# Patient Record
Sex: Male | Born: 1965 | Race: White | Marital: Married | Smoking: Never smoker
Health system: Southern US, Community
[De-identification: ages and names within clinical notes are randomized; demographics above are authoritative.]

## PROBLEM LIST (undated history)

## (undated) DIAGNOSIS — T7501XA Shock due to being struck by lightning, initial encounter: Secondary | ICD-10-CM

## (undated) DIAGNOSIS — E785 Hyperlipidemia, unspecified: Secondary | ICD-10-CM

## (undated) DIAGNOSIS — M349 Systemic sclerosis, unspecified: Secondary | ICD-10-CM

## (undated) DIAGNOSIS — G4733 Obstructive sleep apnea (adult) (pediatric): Secondary | ICD-10-CM

## (undated) DIAGNOSIS — Z9989 Dependence on other enabling machines and devices: Secondary | ICD-10-CM

## (undated) DIAGNOSIS — K219 Gastro-esophageal reflux disease without esophagitis: Secondary | ICD-10-CM

## (undated) HISTORY — DX: Hyperlipidemia, unspecified: E78.5

## (undated) HISTORY — DX: Systemic sclerosis, unspecified: M34.9

## (undated) HISTORY — PX: OTHER SURGICAL HISTORY: SHX169

## (undated) HISTORY — DX: Obstructive sleep apnea (adult) (pediatric): G47.33

## (undated) HISTORY — DX: Dependence on other enabling machines and devices: Z99.89

## (undated) HISTORY — DX: Shock due to being struck by lightning, initial encounter: T75.01XA

## (undated) HISTORY — DX: Gastro-esophageal reflux disease without esophagitis: K21.9

---

## 2012-08-19 ENCOUNTER — Other Ambulatory Visit: Payer: Self-pay

## 2012-08-19 ENCOUNTER — Ambulatory Visit
Admission: RE | Admit: 2012-08-19 | Discharge: 2012-08-19 | Disposition: A | Discharge status: Home / Self Care | Payer: Managed Care, Other (non HMO) | Source: Ambulatory Visit

## 2012-08-19 DIAGNOSIS — M542 Cervicalgia: Secondary | ICD-10-CM

## 2012-08-19 MED ORDER — GADOBENATE DIMEGLUMINE 529 MG/ML IV SOLN
17.0000 mL | Freq: Once | INTRAVENOUS | Status: AC | PRN
  Administered 2012-08-19: 17 mL via INTRAVENOUS

## 2015-02-18 ENCOUNTER — Emergency Department (HOSPITAL_COMMUNITY): Payer: 59

## 2015-02-18 ENCOUNTER — Encounter (HOSPITAL_COMMUNITY): Payer: Self-pay

## 2015-02-18 ENCOUNTER — Emergency Department (HOSPITAL_COMMUNITY)
Admission: EM | Admit: 2015-02-18 | Discharge: 2015-02-18 | Disposition: A | Discharge status: Home / Self Care | Payer: 59 | Attending: Emergency Medicine | Admitting: Emergency Medicine

## 2015-02-18 DIAGNOSIS — R1013 Epigastric pain: Secondary | ICD-10-CM | POA: Diagnosis not present

## 2015-02-18 DIAGNOSIS — R197 Diarrhea, unspecified: Secondary | ICD-10-CM | POA: Insufficient documentation

## 2015-02-18 DIAGNOSIS — R1012 Left upper quadrant pain: Secondary | ICD-10-CM | POA: Insufficient documentation

## 2015-02-18 DIAGNOSIS — J3489 Other specified disorders of nose and nasal sinuses: Secondary | ICD-10-CM | POA: Insufficient documentation

## 2015-02-18 LAB — CBC WITH DIFFERENTIAL/PLATELET
BASOS PCT: 0 % (ref 0–1)
Basophils Absolute: 0 10*3/uL (ref 0.0–0.1)
EOS PCT: 2 % (ref 0–5)
Eosinophils Absolute: 0.2 10*3/uL (ref 0.0–0.7)
HEMATOCRIT: 44.7 % (ref 39.0–52.0)
Hemoglobin: 15.3 g/dL (ref 13.0–17.0)
Lymphocytes Relative: 38 % (ref 12–46)
Lymphs Abs: 2.7 10*3/uL (ref 0.7–4.0)
MCH: 30.2 pg (ref 26.0–34.0)
MCHC: 34.2 g/dL (ref 30.0–36.0)
MCV: 88.3 fL (ref 78.0–100.0)
MONO ABS: 0.7 10*3/uL (ref 0.1–1.0)
Monocytes Relative: 10 % (ref 3–12)
Neutro Abs: 3.6 10*3/uL (ref 1.7–7.7)
Neutrophils Relative %: 50 % (ref 43–77)
Platelets: 184 10*3/uL (ref 150–400)
RBC: 5.06 MIL/uL (ref 4.22–5.81)
RDW: 12.8 % (ref 11.5–15.5)
WBC: 7.2 10*3/uL (ref 4.0–10.5)

## 2015-02-18 LAB — COMPREHENSIVE METABOLIC PANEL
ALBUMIN: 4.2 g/dL (ref 3.5–5.0)
ALT: 51 U/L (ref 17–63)
AST: 43 U/L — AB (ref 15–41)
Alkaline Phosphatase: 71 U/L (ref 38–126)
Anion gap: 5 (ref 5–15)
BILIRUBIN TOTAL: 0.6 mg/dL (ref 0.3–1.2)
BUN: 18 mg/dL (ref 6–20)
CHLORIDE: 104 mmol/L (ref 101–111)
CO2: 29 mmol/L (ref 22–32)
Calcium: 8.8 mg/dL — ABNORMAL LOW (ref 8.9–10.3)
Creatinine, Ser: 0.94 mg/dL (ref 0.61–1.24)
GFR calc Af Amer: >60 mL/min (ref >60)
Glucose, Bld: 111 mg/dL — ABNORMAL HIGH (ref 70–99)
Potassium: 3.9 mmol/L (ref 3.5–5.1)
SODIUM: 138 mmol/L (ref 135–145)
Total Protein: 7.3 g/dL (ref 6.5–8.1)

## 2015-02-18 LAB — URINALYSIS, ROUTINE W REFLEX MICROSCOPIC
Bilirubin Urine: NEGATIVE
Glucose, UA: NEGATIVE mg/dL
Hgb urine dipstick: NEGATIVE
KETONES UR: NEGATIVE mg/dL
Leukocytes, UA: NEGATIVE
Nitrite: NEGATIVE
PROTEIN: NEGATIVE mg/dL
Specific Gravity, Urine: 1.007 (ref 1.005–1.030)
Urobilinogen, UA: 0.2 mg/dL (ref 0.0–1.0)
pH: 6.5 (ref 5.0–8.0)

## 2015-02-18 LAB — LIPASE, BLOOD: Lipase: 32 U/L (ref 22–51)

## 2015-02-18 MED ORDER — CIPROFLOXACIN HCL 500 MG PO TABS: 500.0000 mg | ORAL_TABLET | Freq: Two times a day (BID) | ORAL | Status: DC

## 2015-02-18 MED ORDER — SODIUM CHLORIDE 0.9 % IV SOLN
1000.0000 mL | Freq: Once | INTRAVENOUS | Status: AC
  Administered 2015-02-18: 1000 mL via INTRAVENOUS

## 2015-02-18 MED ORDER — IOHEXOL 300 MG/ML  SOLN
25.0000 mL | Freq: Once | INTRAMUSCULAR | Status: AC | PRN
  Administered 2015-02-18: 25 mL via ORAL

## 2015-02-18 MED ORDER — LOPERAMIDE HCL 2 MG PO CAPS: 2.0000 mg | ORAL_CAPSULE | Freq: Four times a day (QID) | ORAL | Status: DC | PRN

## 2015-02-18 MED ORDER — IOHEXOL 300 MG/ML  SOLN
100.0000 mL | Freq: Once | INTRAMUSCULAR | Status: AC | PRN
  Administered 2015-02-18: 100 mL via INTRAVENOUS

## 2015-02-18 MED ORDER — MORPHINE SULFATE 4 MG/ML IJ SOLN
4.0000 mg | Freq: Once | INTRAMUSCULAR | Status: AC
  Administered 2015-02-18: 4 mg via INTRAVENOUS
  Filled 2015-02-18: qty 1

## 2015-02-18 MED ORDER — ONDANSETRON HCL 4 MG/2ML IJ SOLN
4.0000 mg | Freq: Once | INTRAMUSCULAR | Status: AC
  Administered 2015-02-18: 4 mg via INTRAVENOUS
  Filled 2015-02-18: qty 2

## 2015-02-18 MED ORDER — SODIUM CHLORIDE 0.9 % IV SOLN: 1000.0000 mL | INTRAVENOUS | Status: DC

## 2015-02-18 NOTE — ED Provider Notes (Signed)
CSN: [id number]     Arrival date & time 02/18/15  1832 History   First MD Initiated Contact with Patient 02/18/15 1912     Chief Complaint  Patient presents with  . Diarrhea   HPI Sx started 2 month ago Intermittent episodes of diarrhea.  History of foreign travel. Yesterday started having diarrhea.  Multiple episodes.  Started developing a headache.  Pain around the eyes.   Also having pain in pressure in the abdomen.  Diffusely tender but greater in the LUQ.  Feels that abdomen is distended.  No fevers. No blood in his stool. No vomiting.  No past medical history on file. No past surgical history on file. History reviewed. No pertinent family history. History  Substance Use Topics  . Smoking status: Never Smoker   . Smokeless tobacco: Not on file  . Alcohol Use: Yes    Review of Systems  Constitutional: Negative for fever.  HENT: Positive for sinus pressure.   Respiratory: Negative for cough and shortness of breath.   Genitourinary: Negative for dysuria.  Neurological: Negative for seizures.  All other systems reviewed and are negative.     Allergies  Review of patient's allergies indicates no known allergies.  Home Medications   Prior to Admission medications   Medication Sig Start Date End Date Taking? Authorizing Provider  ciprofloxacin (CIPRO) 500 MG tablet Take 1 tablet (500 mg total) by mouth 2 (two) times daily. 02/18/15   Linwood DibblesJon Mackinley Kiehn, MD  loperamide (IMODIUM) 2 MG capsule Take 1 capsule (2 mg total) by mouth 4 (four) times daily as needed for diarrhea or loose stools. 02/18/15   Linwood DibblesJon Kamyiah Colantonio, MD  Pseudoephedrine-APAP-DM (DAYQUIL PO) Take 2 capsules by mouth every 6 (six) hours as needed (cold like symptoms).   Yes Historical Provider, MD   BP 111/64 mmHg  Pulse 52  Temp(Src) 97.7 F (36.5 C) (Oral)  Resp 14  SpO2 96% Physical Exam  Constitutional: He appears well-developed and well-nourished. No distress.  HENT:  Head: Normocephalic and atraumatic.  Right Ear:  External ear normal.  Left Ear: External ear normal.  Eyes: Conjunctivae are normal. Right eye exhibits no discharge. Left eye exhibits no discharge. No scleral icterus.  Neck: Neck supple. No tracheal deviation present.  Cardiovascular: Normal rate, regular rhythm and intact distal pulses.   Pulmonary/Chest: Effort normal and breath sounds normal. No stridor. No respiratory distress. He has no wheezes. He has no rales.  Abdominal: Soft. Bowel sounds are normal. He exhibits no distension. There is tenderness in the epigastric area and left upper quadrant. There is no rebound and no guarding. No hernia.  Musculoskeletal: He exhibits no edema or tenderness.  Neurological: He is alert. He has normal strength. No cranial nerve deficit (no facial droop, extraocular movements intact, no slurred speech) or sensory deficit. He exhibits normal muscle tone. He displays no seizure activity. Coordination normal.  Skin: Skin is warm and dry. No rash noted.  Psychiatric: He has a normal mood and affect.  Nursing note and vitals reviewed.   ED Course  Procedures (including critical care time) Labs Review Labs Reviewed  COMPREHENSIVE METABOLIC PANEL - Abnormal; Notable for the following:    Glucose, Bld 111 (*)    Calcium 8.8 (*)    AST 43 (*)    All other components within normal limits  CBC WITH DIFFERENTIAL/PLATELET  LIPASE, BLOOD  URINALYSIS, ROUTINE W REFLEX MICROSCOPIC    Imaging Review Ct Abdomen Pelvis W Contrast  02/18/2015   CLINICAL DATA:  Diarrhea and bloating.  EXAM: CT ABDOMEN AND PELVIS WITH CONTRAST  TECHNIQUE: Multidetector CT imaging of the abdomen and pelvis was performed using the standard protocol following bolus administration of intravenous contrast.  CONTRAST:  25mL OMNIPAQUE IOHEXOL 300 MG/ML SOLN, OMNIPAQUE IOHEXOL 300 MG/ML SOLN  COMPARISON:  None.  FINDINGS: Lower chest:  No significant abnormality  Hepatobiliary: There is mild generalized fatty infiltration of the  liver. There is a 1.4 x 1.9 cm hypodense focus in the right hepatic lobe dome which is indeterminate although more likely benign. No other focal liver lesions are evident. There is no bile duct dilatation. The gallbladder is unremarkable.  Pancreas: No significant abnormality  Spleen: Normal  Adrenals/Urinary Tract: The adrenals and kidneys are normal in appearance. There is no urinary calculus evident. There is no hydronephrosis or ureteral dilatation. Collecting systems and ureters appear unremarkable.  Stomach/Bowel: There are normal appearances of the stomach, small bowel and colon. The appendix is normal.  Vascular/Lymphatic: The abdominal aorta is normal in caliber. There is no atherosclerotic calcification. There is no adenopathy in the abdomen or pelvis.  Other: No acute inflammatory changes are evident in the abdomen or pelvis. There is no ascites.  Musculoskeletal: There is a tiny fat containing umbilical hernia.  IMPRESSION: Mild fatty infiltration of the liver. Small focal hypodense lesion in the right hepatic lobe dome, not conclusively characterized. MRI will be the modality of choice for conclusive characterization. No acute findings are evident in the abdomen or pelvis.   Electronically Signed   By: Ellery Plunk M.D.   On: 02/18/2015 22:09     MDM   Final diagnoses:  Diarrhea    CT scan does not show any significant abnormalities. He does have an incidental lesion on the liver. I discussed this finding with the patient to have follow-up with his primary doctor.  No evidence of colitis. Blood testing is otherwise unremarkable. Plan on discharge with medications for Imodium. I will try Cipro empirically because of the recent travel.    Linwood Dibbles, MD 02/18/15 [phone]

## 2015-02-18 NOTE — Discharge Instructions (Signed)

## 2015-02-18 NOTE — ED Notes (Signed)
Patient is alert and oriented x3.  He was given DC instructions and follow up visit instructions.  Patient gave verbal understanding.  He was DC ambulatory under his own power to home.  V/S stable.  He was not showing any signs of distress on DC 

## 2015-02-18 NOTE — ED Notes (Signed)
He c/o frontal h/a yesterday and today.  He also c/o having one episode of diarrhea yesterday and a few more today with a feeling of abdominal "bloating".  He is in no distress.

## 2015-05-22 ENCOUNTER — Other Ambulatory Visit: Payer: Self-pay | Admitting: Gastroenterology

## 2015-05-22 DIAGNOSIS — K769 Liver disease, unspecified: Secondary | ICD-10-CM

## 2015-05-25 ENCOUNTER — Other Ambulatory Visit: Payer: Self-pay | Admitting: Gastroenterology

## 2015-08-27 ENCOUNTER — Ambulatory Visit: Payer: 59 | Attending: Dermatology | Admitting: Physical Therapy

## 2015-08-27 ENCOUNTER — Encounter: Payer: Self-pay | Admitting: Physical Therapy

## 2015-08-27 DIAGNOSIS — M436 Torticollis: Secondary | ICD-10-CM | POA: Diagnosis present

## 2015-08-27 DIAGNOSIS — G729 Myopathy, unspecified: Secondary | ICD-10-CM | POA: Diagnosis not present

## 2015-08-27 DIAGNOSIS — M6289 Other specified disorders of muscle: Secondary | ICD-10-CM

## 2015-08-27 NOTE — Patient Instructions (Addendum)
Chin Protraction / Retraction    Slide head forward keeping chin level. Slide head back, pulling chin in. Hold each position _30_ seconds. Repeat _2_ times. Do _2__ sessions per day.  Copyright  VHI. All rights reserved.  Upper Cervical Flexion / Extension    Gently flex and extend upper neck by nodding head. Try to make a "long neck". Hold _30___ seconds. Repeat __2__ times per set. Do _2___ sets per session. Do _2___ sessions per day.  http://orth.exer.us/350   Copyright  VHI. All rights reserved.  Lateral Flexion    With head in comfortable, centered position and chin slightly tucked, gently bring right ear toward right shoulder. Hold _30___ seconds. Repeat with left side. Repeat _2___ times. Do __2__ sessions per day.  http://gt2.exer.us/8   Copyright  VHI. All rights reserved.  AROM: Neck Rotation    Turn head slowly to look over one shoulder, then the other. Hold each position 30____ seconds. Repeat __2__ times per set. Do __2__ sets per session. Do _2___ sessions per day.  http://orth.exer.us/294   Copyright  VHI. All rights reserved.  Also do Mulligan's stretch with use of towel for overpressure with rotation stretch -hold 30 secs 2 reps

## 2015-08-27 NOTE — Therapy (Signed)
[facility] [address] [place], [place], [address] Phone: [phone]   Fax:  [phone]  Physical Therapy Evaluation  Patient Details  Name: Blake [name] MRN: [id number] Date of Birth: 07-23-1966 Referring Provider: Dr. Wynelle Cleveland  Encounter Date: 08/27/2015      PT End of Session - 08/27/15 0957    Visit Number 1   Number of Visits 4   Date for PT Re-Evaluation 10/20/15   Authorization Type [employer]   PT Start Time 0802   PT Stop Time 0848   PT Time Calculation (min) 46 min      History reviewed. No pertinent past medical history.  History reviewed. No pertinent past surgical history.  There were no vitals filed for this visit.  Visit Diagnosis:  Muscle tightness - Plan: PT plan of care cert/re-cert  Stiffness of neck - Plan: PT plan of care cert/re-cert      Subjective Assessment - 08/27/15 0945    Subjective Pt was diagnosed with scleredema 2 years ago; symptoms really started 3 yrs ago but it took a year to definitively diagnose as scleredema; pt reports main problem is decreased cervical  ROM   Patient Stated Goals Increase cervical ROM   Currently in Pain? No/denies            [facility] PT Assessment - 08/27/15 0809    Assessment   Medical Diagnosis Scleroderma   Referring Provider Dr. Wynelle Cleveland   Onset Date/Surgical Date --  2 yrs ago   Prior Therapy None   Precautions   Precautions None   Balance Screen   Has the patient fallen in the past 6 months No   Has the patient had a decrease in activity level because of a fear of falling?  No   Is the patient reluctant to leave their home because of a fear of falling?  No   Prior Function   Level of Independence Independent   Vocation Full time employment  in management   ROM / Strength   AROM / PROM / Strength AROM   AROM   Cervical Flexion 31   Cervical Extension 28   Cervical - Right Side Bend 26   Cervical - Left  Side Bend 30   Cervical - Right Rotation 39   Cervical - Left Rotation 33                           PT Education - 08/27/15 0953    Education provided Yes   Education Details cervical lateral flexion, rotation, extension and retraction; also gave Mulligan's stretch with use of towel to incr. rotation   Person(s) Educated Patient   Methods Explanation;Demonstration;Handout   Comprehension Verbalized understanding;Returned demonstration             PT Long Term Goals - 08/27/15 1143    PT LONG TERM GOAL #1   Title Independent in HEP for cervical stretches and AROM. (10-20-15)   Time 4   Period Weeks   Status New   PT LONG TERM GOAL #2   Title Increase L cervical rotation to >/= 50 degrees to increase ease and safety with driving. (97-02-63)   Time 4   Period Weeks   Status New               Plan - 08/27/15 7858    Clinical Impression Statement Pt. has moderately decreased cervical AROM with L rotation limited most  due to tightness in R upper traps   Pt will benefit from skilled therapeutic intervention in order to improve on the following deficits Hypomobility;Increased edema;Decreased range of motion;Impaired flexibility   Rehab Potential Good   PT Frequency Biweekly  4 visits over 8 weeks   PT Duration 4 weeks   PT Treatment/Interventions ADLs/Self Care Home Management;Moist Heat;Ultrasound;Therapeutic exercise;Neuromuscular re-education;Patient/family education;Manual techniques   PT Next Visit Plan assess benefits of ultrasound after rx today   PT Home Exercise Plan cervical stretches and AROM   Consulted and Agree with Plan of Care Patient         Problem List There are no active problems to display for this patient.   [name], [name], PT 08/27/2015, 12:38 PM  [facility] [address] [place], [place], [address] Phone: [phone]   Fax:  [phone]  Name:  Blake [name] MRN: [id number] Date of Birth: 10-11-1966

## 2015-09-13 ENCOUNTER — Encounter: Payer: Self-pay | Admitting: Physical Therapy

## 2015-09-17 ENCOUNTER — Ambulatory Visit: Payer: 59 | Admitting: Physical Therapy

## 2015-09-17 DIAGNOSIS — M6289 Other specified disorders of muscle: Secondary | ICD-10-CM

## 2015-09-17 DIAGNOSIS — M436 Torticollis: Secondary | ICD-10-CM

## 2015-09-17 DIAGNOSIS — G729 Myopathy, unspecified: Secondary | ICD-10-CM | POA: Diagnosis not present

## 2015-09-23 ENCOUNTER — Encounter: Payer: Self-pay | Admitting: Physical Therapy

## 2015-09-23 NOTE — Therapy (Signed)
[facility] [address] [place], [place], [address] Phone: [phone]   Fax:  [phone]  Physical Therapy Treatment  Patient Details  Name: Blake [name] MRN: [id number] Date of Birth: [birth year] Referring Provider: Dr. Wynelle ClevelandJoseph Jorizzo  Encounter Date: 09/17/2015      PT End of Session - 09/23/15 1749    Visit Number 2   Number of Visits 4   Date for PT Re-Evaluation 10/20/15   Authorization Type [employer]   PT Start Time 1448   PT Stop Time 1531   PT Time Calculation (min) 43 min      History reviewed. No pertinent past medical history.  History reviewed. No pertinent past surgical history.  There were no vitals filed for this visit.  Visit Diagnosis:  Muscle tightness  Stiffness of neck      Subjective Assessment - 09/23/15 1745    Subjective Pt states that he thinks the ultrasound helped last time- has been doing stretches at home   Patient Stated Goals Increase cervical ROM   Currently in Pain? No/denies                         [facility] - 09/23/15 0001    Modalities   Modalities Ultrasound   Ultrasound   Ultrasound Location bil. upper trap and posterior cervical region   Ultrasound Parameters 1.5 w/cm2 50% pulsed 8" to each side   Ultrasound Goals Edema     TherEx:  Lying prone - lifting arms overhead with shoulder flexion - as tolerated- difficult to perform due to tightness Closed chain wall push ups x 5 reps Quadriped - lifting head into cervical extension as tolerated - 5 reps attempted UBE bike - 3" forward and 3" backward for shoulder and upper thoracic ROM            PT Education - 09/23/15 1748    Education provided Yes   Education Details prone - lifting arms overhead as tolerated   Person(s) Educated Patient   Methods Explanation;Demonstration   Comprehension Verbalized understanding;Returned demonstration             PT Long Term Goals - 08/27/15 1143    PT LONG TERM GOAL #1   Title Independent in HEP for cervical stretches and AROM. (10-20-15)   Time 4   Period Weeks   Status New   PT LONG TERM GOAL #2   Title Increase L cervical rotation to >/= 50 degrees to increase ease and safety with driving. (13-24-4012-31-16)   Time 4   Period Weeks   Status New               Plan - 09/23/15 1756    PT Next Visit Plan cont with US and ther ex; trying quadriped - arms shifted to one side while sitting back on heels; seated - leaning on foream and reaching overhead with other arm        Problem List There are no active problems to display for this patient.   NUUVOZ, DGUYQ IHKVQQVilday, Sabirin Baray Suzanne, PT 09/23/2015, 5:59 PM  [facility] [address] [place], [place], [address] Phone: [phone]   Fax:  [phone]  Name: Blake [name] MRN: [id number] Date of Birth: [birth year]

## 2015-10-01 ENCOUNTER — Ambulatory Visit: Payer: 59 | Attending: Dermatology | Admitting: Physical Therapy

## 2015-10-01 DIAGNOSIS — M6289 Other specified disorders of muscle: Secondary | ICD-10-CM

## 2015-10-01 DIAGNOSIS — G729 Myopathy, unspecified: Secondary | ICD-10-CM | POA: Diagnosis present

## 2015-10-01 DIAGNOSIS — M436 Torticollis: Secondary | ICD-10-CM | POA: Diagnosis present

## 2015-10-07 ENCOUNTER — Encounter: Payer: Self-pay | Admitting: Physical Therapy

## 2015-10-07 NOTE — Therapy (Signed)
[facility] [address] [place], [place], [address] Phone: [phone]   Fax:  [phone]  Physical Therapy Treatment  Patient Details  Name: Blake [name] MRN: [id number] Date of Birth: 20-Nov-1965 Referring Provider: Dr. Saundra Shelling  Encounter Date: 10/01/2015      PT End of Session - 10/07/15 2041    Visit Number 3   Number of Visits 4   Date for PT Re-Evaluation 10/20/15   Authorization Type [employer]   PT Start Time 1533   PT Stop Time 1619   PT Time Calculation (min) 46 min      History reviewed. No pertinent past medical history.  History reviewed. No pertinent past surgical history.  There were no vitals filed for this visit.  Visit Diagnosis:  Muscle tightness  Stiffness of neck      Subjective Assessment - 10/07/15 2038    Subjective Pt states the swelling continues to fluctuate - some days are better than others; states he knows exercises to do now - feels that he is ready for discharge   Patient Stated Goals Increase cervical ROM   Currently in Pain? No/denies                         [facility] Adult PT Treatment/Exercise - 10/07/15 0001    Modalities   Modalities Ultrasound   Ultrasound   Ultrasound Location bil. upper trap and posterior cervical regions   Ultrasound Parameters 1.5 w/cm2 50% pulsed 8" to each side    Ultrasound Goals Edema     TherEx; pectoral stretch in corner x 45 sec hold x 2 reps; Y stretch 30 sec hold x 2 reps Quadriped position - attempting to lift UE up in flexion - 10 sec hold as able  SciFit 5" level 2.0 - UE's and LE's  UBE 3" forward level 1.8;  3" backward level 1.8 for UE AROM  Trunk rotation stretch - cat/camel in quadriped position x 10 sec hold each position Sitting back toward heels with arms outstretched - 30 sec hold x 1 rep              PT Long Term Goals - 10/07/15 2043    PT LONG TERM GOAL #1   Title  Independent in HEP for cervical stretches and AROM. (10-20-15)   Baseline met 10-01-15   Status Achieved   PT LONG TERM GOAL #2   Title Increase L cervical rotation to >/= 50 degrees to increase ease and safety with driving. (60-63-01)   Baseline L cervical rotation 33 degrees; R 39 degrees   Status Not Met               Plan - 10/07/15 2042    Clinical Impression Statement Pt has met LTG #1; LTG #2 not met due to no incr. in cervical rotation AROM  - pt is ready for discharge; status continues to fluctuate depending on activity/edema   Pt will benefit from skilled therapeutic intervention in order to improve on the following deficits Hypomobility;Increased edema;Decreased range of motion;Impaired flexibility   Rehab Potential Good   PT Frequency Biweekly   PT Duration 4 weeks   PT Treatment/Interventions ADLs/Self Care Home Management;Moist Heat;Ultrasound;Therapeutic exercise;Neuromuscular re-education;Patient/family education;Manual techniques   PT Next Visit Plan D/C   PT Home Exercise Plan cervical stretches and AROM   Consulted and Agree with Plan of Care Patient        Problem List There are  no active problems to display for this patient.   PHYSICAL THERAPY DISCHARGE SUMMARY  Visits from Start of Care: 3  Current functional level related to goals / functional outcomes: See above for progress towards goals   Remaining deficits: Continued limited ROM in cervical, shoulder and trunk due to scleredema which fluctuates in severity which impacts ROM    Education / Equipment: Pt has been instructed in a HEP for stretching and ROM for cervical, shoulder and upper trunk musculature Plan: Patient agrees to discharge.  Patient goals were partially met. Patient is being discharged due to meeting the stated rehab goals.  ?????       Alda Lea, PT 10/07/2015, 8:46 PM  [employer] [address] [place], [place], [address] Phone: [phone]   Fax:  [phone]  Name: Blake [name] MRN: [id number] Date of Birth: 1966/01/10

## 2015-11-22 ENCOUNTER — Other Ambulatory Visit: Payer: Self-pay | Admitting: Gastroenterology

## 2015-11-22 DIAGNOSIS — K769 Liver disease, unspecified: Secondary | ICD-10-CM

## 2015-11-29 ENCOUNTER — Ambulatory Visit
Admission: RE | Admit: 2015-11-29 | Discharge: 2015-11-29 | Disposition: A | Discharge status: Home / Self Care | Payer: 59 | Source: Ambulatory Visit | Attending: Gastroenterology | Admitting: Gastroenterology

## 2015-11-29 DIAGNOSIS — K769 Liver disease, unspecified: Secondary | ICD-10-CM

## 2015-11-29 MED ORDER — GADOBENATE DIMEGLUMINE 529 MG/ML IV SOLN
20.0000 mL | Freq: Once | INTRAVENOUS | Status: AC | PRN
  Administered 2015-11-29: 20 mL via INTRAVENOUS

## 2016-05-06 ENCOUNTER — Other Ambulatory Visit (INDEPENDENT_AMBULATORY_CARE_PROVIDER_SITE_OTHER): Payer: 59

## 2016-05-06 DIAGNOSIS — Z0000 Encounter for general adult medical examination without abnormal findings: Secondary | ICD-10-CM | POA: Diagnosis not present

## 2016-05-06 LAB — CBC WITH DIFFERENTIAL/PLATELET
BASOS ABS: 0 10*3/uL (ref 0.0–0.1)
BASOS PCT: 0.4 % (ref 0.0–3.0)
EOS ABS: 0.1 10*3/uL (ref 0.0–0.7)
Eosinophils Relative: 2.1 % (ref 0.0–5.0)
HCT: 46.4 % (ref 39.0–52.0)
Hemoglobin: 16.2 g/dL (ref 13.0–17.0)
LYMPHS ABS: 1.9 10*3/uL (ref 0.7–4.0)
Lymphocytes Relative: 33.4 % (ref 12.0–46.0)
MCHC: 34.9 g/dL (ref 30.0–36.0)
MCV: 87.5 fl (ref 78.0–100.0)
MONOS PCT: 12.6 % — AB (ref 3.0–12.0)
Monocytes Absolute: 0.7 10*3/uL (ref 0.1–1.0)
NEUTROS ABS: 3 10*3/uL (ref 1.4–7.7)
NEUTROS PCT: 51.5 % (ref 43.0–77.0)
PLATELETS: 187 10*3/uL (ref 150.0–400.0)
RBC: 5.3 Mil/uL (ref 4.22–5.81)
RDW: 13 % (ref 11.5–15.5)
WBC: 5.8 10*3/uL (ref 4.0–10.5)

## 2016-05-06 LAB — POC URINALSYSI DIPSTICK (AUTOMATED)
BILIRUBIN UA: NEGATIVE
Glucose, UA: NEGATIVE
Ketones, UA: NEGATIVE
LEUKOCYTES UA: NEGATIVE
NITRITE UA: NEGATIVE
PH UA: 6
PROTEIN UA: NEGATIVE
Spec Grav, UA: 1.02
UROBILINOGEN UA: 1

## 2016-05-06 LAB — HEPATIC FUNCTION PANEL
ALBUMIN: 4.4 g/dL (ref 3.5–5.2)
ALK PHOS: 71 U/L (ref 39–117)
ALT: 55 U/L — AB (ref 0–53)
AST: 43 U/L — AB (ref 0–37)
Bilirubin, Direct: 0.2 mg/dL (ref 0.0–0.3)
TOTAL PROTEIN: 7.5 g/dL (ref 6.0–8.3)
Total Bilirubin: 1.1 mg/dL (ref 0.2–1.2)

## 2016-05-06 LAB — LIPID PANEL
CHOLESTEROL: 154 mg/dL (ref 0–200)
HDL: 39.1 mg/dL (ref >39.00)
LDL Cholesterol: 81 mg/dL (ref 0–99)
NONHDL: 115.24
Total CHOL/HDL Ratio: 4
Triglycerides: 173 mg/dL — ABNORMAL HIGH (ref 0.0–149.0)
VLDL: 34.6 mg/dL (ref 0.0–40.0)

## 2016-05-06 LAB — BASIC METABOLIC PANEL
BUN: 15 mg/dL (ref 6–23)
CHLORIDE: 102 meq/L (ref 96–112)
CO2: 29 mEq/L (ref 19–32)
Calcium: 9.7 mg/dL (ref 8.4–10.5)
Creatinine, Ser: 0.99 mg/dL (ref 0.40–1.50)
GFR: 85.85 mL/min (ref >60.00)
Glucose, Bld: 100 mg/dL — ABNORMAL HIGH (ref 70–99)
POTASSIUM: 4 meq/L (ref 3.5–5.1)
Sodium: 138 mEq/L (ref 135–145)

## 2016-05-06 LAB — TSH: TSH: 1.12 u[IU]/mL (ref 0.35–4.50)

## 2016-05-13 ENCOUNTER — Encounter: Payer: Self-pay | Admitting: Family Medicine

## 2016-05-13 ENCOUNTER — Ambulatory Visit (INDEPENDENT_AMBULATORY_CARE_PROVIDER_SITE_OTHER): Payer: 59 | Admitting: Family Medicine

## 2016-05-13 VITALS — BP 126/80 | HR 60 | Temp 98.1°F | Ht 71.0 in | Wt 241.4 lb

## 2016-05-13 DIAGNOSIS — Z0000 Encounter for general adult medical examination without abnormal findings: Secondary | ICD-10-CM | POA: Diagnosis not present

## 2016-05-13 DIAGNOSIS — E782 Mixed hyperlipidemia: Secondary | ICD-10-CM | POA: Diagnosis not present

## 2016-05-13 DIAGNOSIS — Z9989 Dependence on other enabling machines and devices: Secondary | ICD-10-CM

## 2016-05-13 DIAGNOSIS — R740 Nonspecific elevation of levels of transaminase and lactic acid dehydrogenase [LDH]: Secondary | ICD-10-CM | POA: Diagnosis not present

## 2016-05-13 DIAGNOSIS — R7401 Elevation of levels of liver transaminase levels: Secondary | ICD-10-CM

## 2016-05-13 DIAGNOSIS — G4733 Obstructive sleep apnea (adult) (pediatric): Secondary | ICD-10-CM

## 2016-05-13 NOTE — Progress Notes (Signed)
HPI:  Mr. Correy Bourcier is a 50 y.o.male here today for his routine physical examination. He is former [employer] pt, I last saw him about a year ago.    -Concerns and/or follow up today: none.  Last CPE about a year ago.  He is now following a healthy diet and exercise regularly for about 4-6 weeks. He has already noted wt loss and reporting feeling better in general.     Chronic medical problems: HLD, scleroderma, OSA,and chronic diarrhea among some.  He follows with gastroenterologist, reporting colonoscopy done in December 2016., Polypectomy performed, and 5 years follow up recommended. History of scleroderma, evaluated by dermatologist (Dr Reche Dixon), due to mild case treatment was not deemed necessary.  Hx of OSA, he follows with Dr. Earl Gala at [facility], wearing his CPAP machine daily, he has noted a great deal of improvement of fatigue.  He had labs recently.   Lab Results  Component Value Date   CHOL 154 05/06/2016   HDL 39.10 05/06/2016   LDLCALC 81 05/06/2016   TRIG 173.0 (H) 05/06/2016   CHOLHDL 4 05/06/2016   Lab Results  Component Value Date   CREATININE 0.99 05/06/2016   BUN 15 05/06/2016   NA 138 05/06/2016   K 4.0 05/06/2016   CL 102 05/06/2016   CO2 29 05/06/2016   Lab Results  Component Value Date   WBC 5.8 05/06/2016   HGB 16.2 05/06/2016   HCT 46.4 05/06/2016   MCV 87.5 05/06/2016   PLT 187.0 05/06/2016   Lab Results  Component Value Date   ALT 55 (H) 05/06/2016   AST 43 (H) 05/06/2016   ALKPHOS 71 05/06/2016   BILITOT 1.1 05/06/2016    Hx of STD's: Denies.   + FHx for prostate cancer, father at 82. Negative FHx for colon cancer.   Reporting Tdap 3 years ago at work.   -Denies alcohol abuse, tobacco use, or illicit drug use.  He lives with wife and their 3 children.   Review of Systems  Constitutional: Negative for activity change, appetite change, fatigue, fever and unexpected weight change.    HENT: Negative for dental problem, nosebleeds, sore throat, trouble swallowing and voice change.   Eyes: Negative for discharge, redness and visual disturbance.  Respiratory: Negative for apnea, cough, shortness of breath and wheezing.   Cardiovascular: Negative for chest pain, palpitations and leg swelling.  Gastrointestinal: Negative for abdominal pain, blood in stool, nausea and vomiting.       No changes in bowel habits.  Endocrine: Negative for cold intolerance, heat intolerance, polydipsia and polyuria.  Genitourinary: Negative for decreased urine volume, discharge, dysuria, frequency, genital sores, hematuria and testicular pain.  Musculoskeletal: Positive for back pain (occasional.). Negative for arthralgias, gait problem, joint swelling and myalgias.  Skin: Negative for color change and rash.  Neurological: Negative for dizziness, seizures, syncope, weakness, numbness and headaches.  Hematological: Negative for adenopathy. Does not bruise/bleed easily.  Psychiatric/Behavioral: Negative for confusion and sleep disturbance. The patient is not nervous/anxious.      No current outpatient prescriptions on file prior to visit.   No current facility-administered medications on file prior to visit.      Past Medical History:  Diagnosis Date  . Hyperlipidemia   . OSA on CPAP   . Scleroderma (HCC)     No Known Allergies  Family History  Problem Relation Age of Onset  . Arthritis Mother   . Mental retardation Daughter   . Prostate cancer  Paternal Grandfather 67    Social History   Social History  . Marital status: Married    Spouse name: N/A  . Number of children: N/A  . Years of education: N/A   Social History Main Topics  . Smoking status: Never Smoker  . Smokeless tobacco: Never Used  . Alcohol use Yes  . Drug use: No  . Sexual activity: Yes   Other Topics Concern  . None   Social History Narrative   ** Merged History Encounter **         Vitals:    05/13/16 1101  BP: 126/80  Pulse: 60  Temp: 98.1 F (36.7 C)   Body mass index is 33.66 kg/m. O2 sat at RA 97%.   Wt Readings from Last 3 Encounters:  05/13/16 241 lb 6 oz (109.5 kg)  11/29/15 245 lb (111.1 kg)      Physical Exam  Constitutional: He is oriented to person, place, and time. He appears well-developed. No distress.  HENT:  Head: Atraumatic.  Mouth/Throat: Uvula is midline, oropharynx is clear and moist and mucous membranes are normal.  Eyes: Conjunctivae and EOM are normal. Pupils are equal, round, and reactive to light.  Neck: Normal range of motion. No tracheal deviation present. No thyroid mass and no thyromegaly present.  Cardiovascular: Normal rate and regular rhythm.   No murmur heard. Pulses:      Dorsalis pedis pulses are 2+ on the right side, and 2+ on the left side.  Respiratory: Effort normal and breath sounds normal. No respiratory distress.  GI: Soft. He exhibits no mass. There is no hepatomegaly. There is no tenderness.  Genitourinary:  Genitourinary Comments: Refused,no concerns.  Musculoskeletal: He exhibits no edema or tenderness.  No major deformities appreciated and no signs of synovitis.  Lymphadenopathy:    He has no cervical adenopathy.       Right: No supraclavicular adenopathy present.       Left: No supraclavicular adenopathy present.  Neurological: He is alert and oriented to person, place, and time. He has normal strength. No cranial nerve deficit or sensory deficit. Coordination and gait normal.  Reflex Scores:      Bicep reflexes are 2+ on the right side and 2+ on the left side.      Patellar reflexes are 2+ on the right side and 2+ on the left side. Skin: Skin is warm. No rash noted. No erythema.  Tight/hard skin of his upper back, chest, arms with peau de orange changes on upper back. No significant pigmentation changes appreciated.  No suspicious lesions appreciated. A few scattered cherry moles (hemangiomas), 1-2 mm on  abdomen, chest, and back.  Psychiatric: He has a normal mood and affect.        ASSESSMENT AND PLAN:   Discussed the following assessment and plan:  Routine general medical examination at a health care facility  -Current preventive preventive guidelines discussed. Discussed the importance of a healthy diet and regular exercise for prevention of some chronic medical conditions. Next CPE in a year.   Elevated transaminase level  Mild, improved. Asymptomatic. Continue working on healthy diet and weight loss.  Hyperlipidemia, mixed  Improved. For now he will continue low-fat diet. Low HDL, regular physical activity and  2 oz of wine 5 times per week may also help. Follow-up in 12 months.    -We will continue seeing him annually, before if needed. -He will continue following with gastroenterologist and sleep specialist for chronic diarrhea and OSA respectively.  Return in 1 year (on 05/13/2017) for cpe..     Sindia Kowalczyk G. [place], MD  [facility]. [place] office.

## 2016-05-13 NOTE — Patient Instructions (Addendum)
A few things to remember from today's visit:   Routine general medical examination at a health care facility  Elevated transaminase level  Improved.    At least 150 minutes of moderate exercise per week, daily brisk walking for 15-30 min is a good exercise option. Healthy diet low in saturated (animal) fats and sweets and consisting of fresh fruits and vegetables, lean meats such as fish and white chicken and whole grains.  - Vaccines:  Tdap vaccine every 10 years.  Shingles vaccine recommended at age 32, could be given after 48 years of age but not sure about insurance coverage.  Pneumonia vaccines:  Prevnar 13 at 65 and Pneumovax at 32.   -Screening recommendations for low/normal risk males:  Screening for diabetes at age 79-45 and every 3 years.    Lipid screening at 35 and every 3 years.   Colonoscopy for colon cancer screening at age 75 and until age 40.  Prostate cancer screening: some controversy.        Please be sure medication list is accurate. If a new problem present, please set up appointment sooner than planned today.

## 2016-05-13 NOTE — Progress Notes (Signed)
Pre visit review using our clinic review tool, if applicable. No additional management support is needed unless otherwise documented below in the visit note. 

## 2016-05-22 ENCOUNTER — Ambulatory Visit: Payer: 59 | Admitting: Family Medicine

## 2017-05-08 ENCOUNTER — Other Ambulatory Visit: Payer: 59

## 2017-05-15 ENCOUNTER — Encounter: Payer: 59 | Admitting: Family Medicine

## 2017-06-02 DIAGNOSIS — L940 Localized scleroderma [morphea]: Secondary | ICD-10-CM | POA: Insufficient documentation

## 2017-06-02 DIAGNOSIS — M349 Systemic sclerosis, unspecified: Secondary | ICD-10-CM | POA: Insufficient documentation

## 2017-06-02 NOTE — Progress Notes (Signed)
HPI:  Blake [name] is a 51 y.o.male here today for his routine physical examination.  Last CPE 05/13/16.  He lives with his wife and their 3 children.  Regular exercise 3 or more times per week: 2-3 times per week, not as consistent as he was last year. Following a healthy diet: Has not ben very consistent.This years he has ahd more stress at work and this increase eating, snacking.   Chronic medical problems: OSA on CPAP,HLD, and scleroderma among some. HLD: Currently on nonpharmacologic treatment.  He follows with dermatologist as needed, Dr Reche DixonJorizzo. He [facility] for [facility].  Hx of STD's: Denies.  Colonoscopy done because chronic intermittent diarrhea done in 09/2015, per pt report. He has appt to repeat colonoscopy this year at [facility]. Tdap 3-4 years ago, he thinks it was through work.  -Denies high alcohol intake, tobacco use, or Hx of illicit drug use.  He has no concerns today.    Review of Systems  Constitutional: Negative for activity change, appetite change, fatigue, fever and unexpected weight change.  HENT: Negative for dental problem, nosebleeds, sore throat, trouble swallowing and voice change.   Eyes: Negative for redness and visual disturbance.  Respiratory: Negative for cough, shortness of breath and wheezing.   Cardiovascular: Negative for chest pain, palpitations and leg swelling.  Gastrointestinal: Negative for abdominal pain, blood in stool, nausea and vomiting.  Endocrine: Negative for polydipsia and polyuria.  Genitourinary: Negative for decreased urine volume, dysuria, genital sores, hematuria and testicular pain.  Musculoskeletal: Negative for gait problem, joint swelling and myalgias.  Skin: Negative for color change and rash.  Neurological: Negative for dizziness, syncope, weakness, numbness and headaches.  Hematological: Negative for adenopathy. Does not bruise/bleed easily.    Psychiatric/Behavioral: Negative for confusion and sleep disturbance. The patient is not nervous/anxious.   All other systems reviewed and are negative.    Current Outpatient Prescriptions on File Prior to Visit  Medication Sig Dispense Refill  . Probiotic Product (ALIGN PO) Take by mouth daily.     No current facility-administered medications on file prior to visit.      Past Medical History:  Diagnosis Date  . Hyperlipidemia   . OSA on CPAP   . Scleroderma (HCC)    No past surgical history on file.  No Known Allergies  Family History  Problem Relation Age of Onset  . Arthritis Mother   . Mental retardation Daughter   . Prostate cancer Paternal Grandfather [id number]    Social History   Social History  . Marital status: Married    Spouse name: N/A  . Number of children: N/A  . Years of education: N/A   Social History Main Topics  . Smoking status: Never Smoker  . Smokeless tobacco: Never Used  . Alcohol use Yes  . Drug use: No  . Sexual activity: Yes   Other Topics Concern  . None   Social History Narrative   ** Merged History Encounter **         Vitals:   06/03/17 0747  BP: 120/80  Pulse: 66  Resp: 12  SpO2: 95%   Body mass index is 34.21 kg/m.   Wt Readings from Last 3 Encounters:  06/03/17 245 lb 4 oz (111.2 kg)  05/13/16 241 lb 6 oz (109.5 kg)  11/29/15 245 lb (111.1 kg)    Physical Exam  Nursing note and vitals reviewed. Constitutional: He is oriented to person,  place, and time. He appears well-developed. No distress.  HENT:  Head: Normocephalic and atraumatic.  Right Ear: Hearing, tympanic membrane, external ear and ear canal normal.  Left Ear: Hearing, tympanic membrane, external ear and ear canal normal.  Mouth/Throat: Oropharynx is clear and moist and mucous membranes are normal.  Excess cerumen bilateral, TM's partially visualized.  Eyes: Pupils are equal, round, and reactive to light. Conjunctivae and EOM are normal.  Neck:  Normal range of motion. No tracheal deviation present. No thyromegaly present.  Cardiovascular: Normal rate and regular rhythm.   No murmur heard. Pulses:      Dorsalis pedis pulses are 2+ on the right side, and 2+ on the left side.  Respiratory: Effort normal and breath sounds normal. No respiratory distress.  GI: Soft. He exhibits no mass. There is no tenderness.  Genitourinary:  Genitourinary Comments: Deferred,no concerns.  Musculoskeletal: He exhibits no edema or tenderness.  No major deformities appreciated and no signs of synovitis.  Lymphadenopathy:    He has no cervical adenopathy.       Right: No supraclavicular adenopathy present.       Left: No supraclavicular adenopathy present.  Neurological: He is alert and oriented to person, place, and time. He has normal strength. No cranial nerve deficit or sensory deficit. Coordination and gait normal.  Reflex Scores:      Bicep reflexes are 2+ on the right side and 2+ on the left side.      Patellar reflexes are 2+ on the right side and 2+ on the left side. Skin: Skin is warm. No rash noted. No erythema.  Harding-firm skin on trunk,upper extremities,back,and neck. No rash or pigmentation changes.  Psychiatric: He has a normal mood and affect.     ASSESSMENT AND PLAN:   [name] was seen today for annual exam.  Diagnoses and all orders for this visit:  Lab Results  Component Value Date   CHOL 157 06/03/2017   HDL 44.60 06/03/2017   LDLCALC 81 06/03/2017   TRIG 155.0 (H) 06/03/2017   CHOLHDL 4 06/03/2017   Lab Results  Component Value Date   HGBA1C 5.5 06/03/2017     Chemistry      Component Value Date/Time   NA 138 06/03/2017 0842   K 4.1 06/03/2017 0842   CL 102 06/03/2017 0842   CO2 28 06/03/2017 0842   BUN 17 06/03/2017 0842   CREATININE 0.94 06/03/2017 0842      Component Value Date/Time   CALCIUM 9.2 06/03/2017 0842   ALKPHOS 62 06/03/2017 0842   AST 46 (H) 06/03/2017 0842   ALT 71 (H) 06/03/2017  0842   BILITOT 0.9 06/03/2017 0842      Routine general medical examination at a health care facility   We discussed the importance of regular physical activity and healthy diet for prevention of chronic illness and/or complications. Preventive guidelines reviewed. Vaccination up to date, per pt report. Next CPE in 1 year.  The 10-year ASCVD risk score Denman George DC Montez Hageman., et al., 2013) is: 2%   Values used to calculate the score:     Age: 17 years     Sex: Male     Is Non-Hispanic African American: No     Diabetic: No     Tobacco smoker: No     Systolic Blood Pressure: 120 mmHg     Is BP treated: No     HDL Cholesterol: 44.6 mg/dL     Total Cholesterol: 157 mg/dL  Scleroderma, localized  Stable. Monitor for changes or pigmentations changes among some. Continue following with derma as needed.  -     Hemoglobin A1c  Diabetes mellitus screening -     Comprehensive metabolic panel -     Hemoglobin A1c  Hyperlipidemia, mixed  Continue low fat diet. We will follow labs done today and will give further recommendations accordingly.  -     Lipid panel  Encounter for screening for HIV -     HIV antibody  OSA on CPAP  Continue following with Dr Earl Gala. Wt loss recommended.    Return in 1 year (on 06/03/2018).    Dyana Magner G. [place], MD  [facility]. [place] office.

## 2017-06-03 ENCOUNTER — Encounter: Payer: Self-pay | Admitting: Family Medicine

## 2017-06-03 ENCOUNTER — Ambulatory Visit (INDEPENDENT_AMBULATORY_CARE_PROVIDER_SITE_OTHER): Payer: 59 | Admitting: Family Medicine

## 2017-06-03 ENCOUNTER — Other Ambulatory Visit: Payer: Self-pay | Admitting: Family Medicine

## 2017-06-03 VITALS — BP 120/80 | HR 66 | Resp 12 | Ht 71.0 in | Wt 245.2 lb

## 2017-06-03 DIAGNOSIS — L940 Localized scleroderma [morphea]: Secondary | ICD-10-CM

## 2017-06-03 DIAGNOSIS — E782 Mixed hyperlipidemia: Secondary | ICD-10-CM

## 2017-06-03 DIAGNOSIS — Z131 Encounter for screening for diabetes mellitus: Secondary | ICD-10-CM | POA: Diagnosis not present

## 2017-06-03 DIAGNOSIS — Z9989 Dependence on other enabling machines and devices: Secondary | ICD-10-CM | POA: Diagnosis not present

## 2017-06-03 DIAGNOSIS — Z0000 Encounter for general adult medical examination without abnormal findings: Secondary | ICD-10-CM | POA: Diagnosis not present

## 2017-06-03 DIAGNOSIS — G4733 Obstructive sleep apnea (adult) (pediatric): Secondary | ICD-10-CM | POA: Diagnosis not present

## 2017-06-03 DIAGNOSIS — R740 Nonspecific elevation of levels of transaminase and lactic acid dehydrogenase [LDH]: Principal | ICD-10-CM

## 2017-06-03 DIAGNOSIS — Z114 Encounter for screening for human immunodeficiency virus [HIV]: Secondary | ICD-10-CM | POA: Diagnosis not present

## 2017-06-03 DIAGNOSIS — R7401 Elevation of levels of liver transaminase levels: Secondary | ICD-10-CM

## 2017-06-03 LAB — COMPREHENSIVE METABOLIC PANEL
ALBUMIN: 4.4 g/dL (ref 3.5–5.2)
ALT: 71 U/L — AB (ref 0–53)
AST: 46 U/L — AB (ref 0–37)
Alkaline Phosphatase: 62 U/L (ref 39–117)
BUN: 17 mg/dL (ref 6–23)
CHLORIDE: 102 meq/L (ref 96–112)
CO2: 28 meq/L (ref 19–32)
Calcium: 9.2 mg/dL (ref 8.4–10.5)
Creatinine, Ser: 0.94 mg/dL (ref 0.40–1.50)
GFR: 90.73 mL/min (ref >60.00)
GLUCOSE: 108 mg/dL — AB (ref 70–99)
POTASSIUM: 4.1 meq/L (ref 3.5–5.1)
SODIUM: 138 meq/L (ref 135–145)
TOTAL PROTEIN: 6.8 g/dL (ref 6.0–8.3)
Total Bilirubin: 0.9 mg/dL (ref 0.2–1.2)

## 2017-06-03 LAB — LIPID PANEL
CHOL/HDL RATIO: 4
Cholesterol: 157 mg/dL (ref 0–200)
HDL: 44.6 mg/dL (ref >39.00)
LDL CALC: 81 mg/dL (ref 0–99)
NONHDL: 112.03
Triglycerides: 155 mg/dL — ABNORMAL HIGH (ref 0.0–149.0)
VLDL: 31 mg/dL (ref 0.0–40.0)

## 2017-06-03 LAB — HEMOGLOBIN A1C: HEMOGLOBIN A1C: 5.5 % (ref 4.6–6.5)

## 2017-06-03 NOTE — Patient Instructions (Addendum)
A few things to remember from today's visit:   Routine general medical examination at a health care facility  Scleroderma, localized - Plan: Hemoglobin A1c  Diabetes mellitus screening - Plan: Comprehensive metabolic panel, Hemoglobin A1c  Hyperlipidemia, mixed - Plan: Lipid panel  Encounter for screening for HIV - Plan: HIV antibody  OSA on CPAP    At least 150 minutes of moderate exercise per week, daily brisk walking for 15-30 min is a good exercise option. Healthy diet low in saturated (animal) fats and sweets and consisting of fresh fruits and vegetables, lean meats such as fish and white chicken and whole grains.  - Vaccines:  Tdap vaccine every 10 years.  Shingles vaccine recommended at age 49, could be given after 49 years of age but not sure about insurance coverage.  Pneumonia vaccines:  Prevnar 13 at 65 and Pneumovax at 7166.   -Screening recommendations for low/normal risk males:  Screening for diabetes at age 49-45 and every 3 years.    Lipid screening at 35 and every 3 years.   Colonoscopy for colon cancer screening at age 150 and until age 49.   Prostate cancer screening: some controversy.        Please be sure medication list is accurate. If a new problem present, please set up appointment sooner than planned today.

## 2017-06-04 LAB — HIV ANTIBODY (ROUTINE TESTING W REFLEX): HIV: NONREACTIVE

## 2017-07-09 ENCOUNTER — Encounter: Payer: Self-pay | Admitting: Family Medicine

## 2017-10-06 ENCOUNTER — Other Ambulatory Visit (INDEPENDENT_AMBULATORY_CARE_PROVIDER_SITE_OTHER): Payer: 59

## 2017-10-06 DIAGNOSIS — R740 Nonspecific elevation of levels of transaminase and lactic acid dehydrogenase [LDH]: Secondary | ICD-10-CM | POA: Diagnosis not present

## 2017-10-06 DIAGNOSIS — R7401 Elevation of levels of liver transaminase levels: Secondary | ICD-10-CM

## 2017-10-06 LAB — HEPATIC FUNCTION PANEL
ALT: 70 U/L — ABNORMAL HIGH (ref 0–53)
AST: 54 U/L — ABNORMAL HIGH (ref 0–37)
Albumin: 4.2 g/dL (ref 3.5–5.2)
Alkaline Phosphatase: 68 U/L (ref 39–117)
BILIRUBIN DIRECT: 0.1 mg/dL (ref 0.0–0.3)
BILIRUBIN TOTAL: 0.9 mg/dL (ref 0.2–1.2)
TOTAL PROTEIN: 7.4 g/dL (ref 6.0–8.3)

## 2017-10-17 ENCOUNTER — Encounter: Payer: Self-pay | Admitting: Family Medicine

## 2018-03-19 ENCOUNTER — Ambulatory Visit (INDEPENDENT_AMBULATORY_CARE_PROVIDER_SITE_OTHER): Payer: 59 | Admitting: Family Medicine

## 2018-03-19 ENCOUNTER — Ambulatory Visit: Payer: 59 | Admitting: Family Medicine

## 2018-03-19 ENCOUNTER — Encounter: Payer: Self-pay | Admitting: Family Medicine

## 2018-03-19 VITALS — BP 126/85 | HR 67 | Temp 97.8°F | Resp 12 | Ht 71.0 in | Wt 236.1 lb

## 2018-03-19 DIAGNOSIS — R7989 Other specified abnormal findings of blood chemistry: Secondary | ICD-10-CM

## 2018-03-19 DIAGNOSIS — Z8349 Family history of other endocrine, nutritional and metabolic diseases: Secondary | ICD-10-CM | POA: Diagnosis not present

## 2018-03-19 DIAGNOSIS — R55 Syncope and collapse: Secondary | ICD-10-CM | POA: Diagnosis not present

## 2018-03-19 DIAGNOSIS — R740 Nonspecific elevation of levels of transaminase and lactic acid dehydrogenase [LDH]: Secondary | ICD-10-CM

## 2018-03-19 DIAGNOSIS — R251 Tremor, unspecified: Secondary | ICD-10-CM

## 2018-03-19 DIAGNOSIS — R7401 Elevation of levels of liver transaminase levels: Secondary | ICD-10-CM

## 2018-03-19 LAB — CBC WITH DIFFERENTIAL/PLATELET
BASOS PCT: 0.3 % (ref 0.0–3.0)
Basophils Absolute: 0 10*3/uL (ref 0.0–0.1)
Eosinophils Absolute: 0.2 10*3/uL (ref 0.0–0.7)
Eosinophils Relative: 3.1 % (ref 0.0–5.0)
HEMATOCRIT: 45.6 % (ref 39.0–52.0)
Hemoglobin: 15.8 g/dL (ref 13.0–17.0)
LYMPHS PCT: 31.4 % (ref 12.0–46.0)
Lymphs Abs: 1.8 10*3/uL (ref 0.7–4.0)
MCHC: 34.5 g/dL (ref 30.0–36.0)
MCV: 85.6 fl (ref 78.0–100.0)
MONOS PCT: 12.4 % — AB (ref 3.0–12.0)
Monocytes Absolute: 0.7 10*3/uL (ref 0.1–1.0)
NEUTROS ABS: 3 10*3/uL (ref 1.4–7.7)
Neutrophils Relative %: 52.8 % (ref 43.0–77.0)
PLATELETS: 170 10*3/uL (ref 150.0–400.0)
RBC: 5.33 Mil/uL (ref 4.22–5.81)
RDW: 13.4 % (ref 11.5–15.5)
WBC: 5.6 10*3/uL (ref 4.0–10.5)

## 2018-03-19 LAB — IBC PANEL
Iron: 146 ug/dL (ref 42–165)
Saturation Ratios: 43.8 % (ref 20.0–50.0)
TRANSFERRIN: 238 mg/dL (ref 212.0–360.0)

## 2018-03-19 LAB — COMPREHENSIVE METABOLIC PANEL
ALT: 48 U/L (ref 0–53)
AST: 30 U/L (ref 0–37)
Albumin: 4.2 g/dL (ref 3.5–5.2)
Alkaline Phosphatase: 78 U/L (ref 39–117)
BILIRUBIN TOTAL: 0.8 mg/dL (ref 0.2–1.2)
BUN: 10 mg/dL (ref 6–23)
CALCIUM: 9.5 mg/dL (ref 8.4–10.5)
CHLORIDE: 103 meq/L (ref 96–112)
CO2: 29 meq/L (ref 19–32)
Creatinine, Ser: 0.78 mg/dL (ref 0.40–1.50)
GFR: 112.16 mL/min (ref >60.00)
Glucose, Bld: 104 mg/dL — ABNORMAL HIGH (ref 70–99)
Potassium: 4.3 mEq/L (ref 3.5–5.1)
Sodium: 140 mEq/L (ref 135–145)
Total Protein: 7.1 g/dL (ref 6.0–8.3)

## 2018-03-19 LAB — TSH

## 2018-03-19 LAB — VITAMIN B12: VITAMIN B 12: 360 pg/mL (ref 211–911)
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
## 2018-03-19 NOTE — Patient Instructions (Addendum)
A few things to remember from today's visit:   Pre-syncope - Plan: EKG 12-Lead  Tremor of left hand - Plan: CBC with Differential/Platelet, Comprehensive metabolic panel, Vitamin B12, TSH, Ambulatory referral to Neurology  Elevated transaminase level - Plan: Hepatitis B surface antibody, Hepatitis B surface antigen, Hepatitis C antibody, IBC panel  Vit E 400 U daily.   Fat and Cholesterol Restricted Diet Getting too much fat and cholesterol in your diet may cause health problems. Following this diet helps keep your fat and cholesterol at normal levels. This can keep you from getting sick. What types of fat should I choose?  Choose monosaturated and polyunsaturated fats. These are found in foods such as olive oil, canola oil, flaxseeds, walnuts, almonds, and seeds.  Eat more omega-3 fats. Good choices include salmon, mackerel, sardines, tuna, flaxseed oil, and ground flaxseeds.  Limit saturated fats. These are in animal products such as meats, butter, and cream. They can also be in plant products such as palm oil, palm kernel oil, and coconut oil.  Avoid foods with partially hydrogenated oils in them. These contain trans fats. Examples of foods that have trans fats are stick margarine, some tub margarines, cookies, crackers, and other baked goods. What general guidelines do I need to follow?  Check food labels. Look for the words "trans fat" and "saturated fat."  When preparing a meal: ? Fill half of your plate with vegetables and green salads. ? Fill one fourth of your plate with whole grains. Look for the word "whole" as the first word in the ingredient list. ? Fill one fourth of your plate with lean protein foods.  Eat more foods that have fiber, like apples, carrots, beans, peas, and barley.  Eat more home-cooked foods. Eat less at restaurants and buffets.  Limit or avoid alcohol.  Limit foods high in starch and sugar.  Limit fried foods.  Cook foods without frying them.  Baking, boiling, grilling, and broiling are all great options.  Lose weight if you are overweight. Losing even a small amount of weight can help your overall health. It can also help prevent diseases such as diabetes and heart disease. What foods can I eat? Grains Whole grains, such as whole wheat or whole grain breads, crackers, cereals, and pasta. Unsweetened oatmeal, bulgur, barley, quinoa, or brown rice. Corn or whole wheat flour tortillas. Vegetables Fresh or frozen vegetables (raw, steamed, roasted, or grilled). Green salads. Fruits All fresh, canned (in natural juice), or frozen fruits. Meat and Other Protein Products Ground beef (85% or leaner), grass-fed beef, or beef trimmed of fat. Skinless chicken or [place]. Ground chicken or [place]. Pork trimmed of fat. All fish and seafood. Eggs. Dried beans, peas, or lentils. Unsalted nuts or seeds. Unsalted canned or dry beans. Dairy Low-fat dairy products, such as skim or 1% milk, 2% or reduced-fat cheeses, low-fat ricotta or cottage cheese, or plain low-fat yogurt. Fats and Oils Tub margarines without trans fats. Light or reduced-fat mayonnaise and salad dressings. Avocado. Olive, canola, sesame, or safflower oils. Natural peanut or almond butter (choose ones without added sugar and oil). The items listed above may not be a complete list of recommended foods or beverages. Contact your dietitian for more options. What foods are not recommended? Grains White bread. White pasta. White rice. Cornbread. Bagels, pastries, and croissants. Crackers that contain trans fat. Vegetables White potatoes. Corn. Creamed or fried vegetables. Vegetables in a cheese sauce. Fruits Dried fruits. Canned fruit in light or heavy syrup. Fruit juice. Meat and  Other Protein Products Fatty cuts of meat. Ribs, chicken wings, bacon, sausage, bologna, salami, chitterlings, fatback, hot dogs, bratwurst, and packaged luncheon meats. Liver and organ meats. Dairy Whole or  2% milk, cream, half-and-half, and cream cheese. Whole milk cheeses. Whole-fat or sweetened yogurt. Full-fat cheeses. Nondairy creamers and whipped toppings. Processed cheese, cheese spreads, or cheese curds. Sweets and Desserts Corn syrup, sugars, honey, and molasses. Candy. Jam and jelly. Syrup. Sweetened cereals. Cookies, pies, cakes, donuts, muffins, and ice cream. Fats and Oils Butter, stick margarine, lard, shortening, ghee, or bacon fat. Coconut, palm kernel, or palm oils. Beverages Alcohol. Sweetened drinks (such as sodas, lemonade, and fruit drinks or punches). The items listed above may not be a complete list of foods and beverages to avoid. Contact your dietitian for more information. This information is not intended to replace advice given to you by your health care provider. Make sure you discuss any questions you have with your health care provider. Document Released: 04/06/2012 Document Revised: 06/12/2016 Document Reviewed: 01/05/2014 Elsevier Interactive Patient Education  [employer].  Please be sure medication list is accurate. If a new problem present, please set up appointment sooner than planned today.

## 2018-03-20 ENCOUNTER — Encounter: Payer: Self-pay | Admitting: Family Medicine

## 2018-03-20 LAB — HEPATITIS B SURFACE ANTIGEN: HEP B S AG: NONREACTIVE

## 2018-03-20 LAB — HEPATITIS C ANTIBODY
Hepatitis C Ab: NONREACTIVE
SIGNAL TO CUT-OFF: 0.03 (ref <1.00)

## 2018-03-20 LAB — HEPATITIS B SURFACE ANTIBODY,QUALITATIVE: HEP B S AB: NONREACTIVE

## 2018-03-22 ENCOUNTER — Encounter: Payer: Self-pay | Admitting: Family Medicine
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
## 2018-03-26 ENCOUNTER — Ambulatory Visit (INDEPENDENT_AMBULATORY_CARE_PROVIDER_SITE_OTHER): Payer: 59 | Admitting: Neurology

## 2018-03-26 ENCOUNTER — Encounter: Payer: Self-pay | Admitting: Neurology

## 2018-03-26 VITALS — BP 126/76 | HR 64 | Ht 71.0 in | Wt 237.0 lb

## 2018-03-26 DIAGNOSIS — R251 Tremor, unspecified: Secondary | ICD-10-CM | POA: Diagnosis not present

## 2018-03-26 DIAGNOSIS — E0590 Thyrotoxicosis, unspecified without thyrotoxic crisis or storm: Secondary | ICD-10-CM | POA: Diagnosis not present

## 2018-03-29 ENCOUNTER — Other Ambulatory Visit (INDEPENDENT_AMBULATORY_CARE_PROVIDER_SITE_OTHER): Payer: 59

## 2018-03-29 DIAGNOSIS — R7989 Other specified abnormal findings of blood chemistry: Secondary | ICD-10-CM | POA: Diagnosis not present

## 2018-03-29 LAB — TSH: TSH: <0.01 u[IU]/mL — ABNORMAL LOW (ref 0.35–4.50)

## 2018-03-29 LAB — T3, FREE: T3, Free: 4.5 pg/mL — ABNORMAL HIGH (ref 2.3–4.2)

## 2018-03-29 LAB — T4, FREE: Free T4: 1.24 ng/dL (ref 0.60–1.60)

## 2018-03-30 ENCOUNTER — Encounter: Payer: Self-pay | Admitting: Family Medicine

## 2018-03-30 ENCOUNTER — Other Ambulatory Visit: Payer: Self-pay | Admitting: Family Medicine

## 2018-03-30 DIAGNOSIS — E039 Hypothyroidism, unspecified: Secondary | ICD-10-CM

## 2018-04-01 LAB — THYROTROPIN RECEPTOR AUTOABS: Thyrotropin Receptor Ab: 7.6 % (ref <=16.0)

## 2018-04-04 ENCOUNTER — Encounter: Payer: Self-pay | Admitting: Family Medicine

## 2018-05-18 ENCOUNTER — Encounter: Payer: Self-pay | Admitting: Family Medicine

## 2018-05-18 ENCOUNTER — Ambulatory Visit (INDEPENDENT_AMBULATORY_CARE_PROVIDER_SITE_OTHER): Payer: 59 | Admitting: Family Medicine

## 2018-05-18 VITALS — BP 122/68 | HR 63 | Temp 97.6°F | Resp 12 | Ht 71.0 in | Wt 234.0 lb

## 2018-05-18 DIAGNOSIS — E0590 Thyrotoxicosis, unspecified without thyrotoxic crisis or storm: Secondary | ICD-10-CM | POA: Diagnosis not present

## 2018-05-18 DIAGNOSIS — E063 Autoimmune thyroiditis: Secondary | ICD-10-CM | POA: Diagnosis not present

## 2018-05-18 DIAGNOSIS — E041 Nontoxic single thyroid nodule: Secondary | ICD-10-CM | POA: Diagnosis not present

## 2018-05-18 LAB — T3, FREE: T3, Free: 4.1 pg/mL (ref 2.3–4.2)

## 2018-05-18 LAB — TSH

## 2018-05-18 LAB — T4, FREE: Free T4: 1.13 ng/dL (ref 0.60–1.60)

## 2018-05-18 NOTE — Patient Instructions (Addendum)
A few things to remember from today's visit:   Hashimoto's thyroiditis  Hyperthyroidism - Plan: T4, free, T3, free, TSH  Keep appointment with Dr Lucianne MussKumar.  Do not start Propranolol.   Please be sure medication list is accurate. If a new problem present, please set up appointment sooner than planned today.
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
## 2018-05-22 ENCOUNTER — Encounter: Payer: Self-pay | Admitting: Family Medicine

## 2018-05-31 ENCOUNTER — Ambulatory Visit (INDEPENDENT_AMBULATORY_CARE_PROVIDER_SITE_OTHER): Payer: 59 | Admitting: Endocrinology

## 2018-05-31 ENCOUNTER — Encounter: Payer: Self-pay | Admitting: Endocrinology

## 2018-05-31 VITALS — BP 134/76 | HR 70 | Ht 71.0 in | Wt 236.2 lb

## 2018-05-31 DIAGNOSIS — E0590 Thyrotoxicosis, unspecified without thyrotoxic crisis or storm: Secondary | ICD-10-CM | POA: Diagnosis not present

## 2018-05-31 DIAGNOSIS — E041 Nontoxic single thyroid nodule: Secondary | ICD-10-CM | POA: Diagnosis not present
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
## 2018-06-02 ENCOUNTER — Telehealth: Payer: Self-pay

## 2018-06-02 NOTE — Telephone Encounter (Addendum)
-----   Message from Reather LittlerAjay Kumar, MD sent at 06/02/2018  2:28 PM EDT ----- Regarding: FW: Interpretation of outside films Thanks Dr Bradly ChrisStroud  Sheccid Lahmann/Stephanie:  Please call the patient and have him take his radiology disks to either [facility] or [facility] medicine department for review by Dr. Bradly ChrisStroud

## 2018-06-02 NOTE — Telephone Encounter (Signed)
LMTCB

## 2018-06-03 ENCOUNTER — Telehealth: Payer: Self-pay | Admitting: Endocrinology

## 2018-06-03 NOTE — Telephone Encounter (Signed)
LM for pt to call back to discuss

## 2018-06-03 NOTE — Telephone Encounter (Signed)
-----   Message from Reather LittlerAjay Kumar, MD sent at 06/02/2018  2:28 PM EDT ----- Regarding: FW: Interpretation of outside films Thanks Dr Bradly ChrisStroud  Courtney/Stephanie:  Please call the patient and have him take his radiology disks to either [facility] or [facility] nuclear medicine department for review by Dr. Bradly ChrisStroud   ----- Message ----- From: Signa KellStroud, Taylor, MD Sent: 06/02/2018   1:36 PM EDT To: Reather LittlerAjay Kumar, MD Subject: RE: Interpretation of outside films            AJay,  I'd be happy to take a look!  No since in repeating a perfectly good study.  But I suppose it depends on how recent they were done.     Please have him bring to [facility] attention to me.  Thank you!  Ladona Ridgelaylor  ----- Message ----- From: Reather LittlerKumar, Ajay, MD Sent: 05/31/2018   9:00 PM EDT To: Signa Kellaylor Stroud, MD Subject: Interpretation of outside films                Ladona Ridgelaylor  This patient has thyroid nuclear studies done [place] and has the images on a disc.  He appears to have a hot nodule on the left and needs radioactive iodine treatment. Would like to know if these studies are adequate to schedule the radioactive iodine treatment.  I can have him bring the CDs to the nuclear medicine if you could look at these  Thanks  Ajay
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
## 2018-06-09 ENCOUNTER — Encounter: Payer: 59 | Admitting: Family Medicine

## 2018-06-09 DIAGNOSIS — Z0289 Encounter for other administrative examinations: Secondary | ICD-10-CM
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
## 2018-06-30 ENCOUNTER — Ambulatory Visit (INDEPENDENT_AMBULATORY_CARE_PROVIDER_SITE_OTHER): Payer: 59 | Admitting: Family Medicine

## 2018-06-30 ENCOUNTER — Encounter: Payer: Self-pay | Admitting: Family Medicine

## 2018-06-30 VITALS — BP 110/70 | HR 65 | Temp 97.8°F | Resp 12 | Ht 71.0 in | Wt 235.0 lb

## 2018-06-30 DIAGNOSIS — Z0000 Encounter for general adult medical examination without abnormal findings: Secondary | ICD-10-CM | POA: Diagnosis not present

## 2018-06-30 DIAGNOSIS — E782 Mixed hyperlipidemia: Secondary | ICD-10-CM | POA: Diagnosis not present

## 2018-06-30 DIAGNOSIS — Z23 Encounter for immunization: Secondary | ICD-10-CM | POA: Diagnosis not present

## 2018-06-30 DIAGNOSIS — J310 Chronic rhinitis: Secondary | ICD-10-CM

## 2018-06-30 DIAGNOSIS — Z131 Encounter for screening for diabetes mellitus: Secondary | ICD-10-CM | POA: Diagnosis not present

## 2018-06-30 DIAGNOSIS — Z125 Encounter for screening for malignant neoplasm of prostate: Secondary | ICD-10-CM | POA: Diagnosis not present

## 2018-06-30 LAB — LIPID PANEL
CHOL/HDL RATIO: 4
CHOLESTEROL: 176 mg/dL (ref 0–200)
HDL: 49 mg/dL (ref >39.00)
NonHDL: 126.81
Triglycerides: 218 mg/dL — ABNORMAL HIGH (ref 0.0–149.0)
VLDL: 43.6 mg/dL — ABNORMAL HIGH (ref 0.0–40.0)

## 2018-06-30 LAB — COMPREHENSIVE METABOLIC PANEL
ALBUMIN: 4.3 g/dL (ref 3.5–5.2)
ALT: 43 U/L (ref 0–53)
AST: 30 U/L (ref 0–37)
Alkaline Phosphatase: 81 U/L (ref 39–117)
BUN: 15 mg/dL (ref 6–23)
CHLORIDE: 101 meq/L (ref 96–112)
CO2: 31 mEq/L (ref 19–32)
CREATININE: 0.92 mg/dL (ref 0.40–1.50)
Calcium: 9.6 mg/dL (ref 8.4–10.5)
GFR: 92.6 mL/min (ref >60.00)
GLUCOSE: 101 mg/dL — AB (ref 70–99)
Potassium: 4.3 mEq/L (ref 3.5–5.1)
SODIUM: 139 meq/L (ref 135–145)
TOTAL PROTEIN: 7.3 g/dL (ref 6.0–8.3)
Total Bilirubin: 1.1 mg/dL (ref 0.2–1.2)

## 2018-06-30 LAB — LDL CHOLESTEROL, DIRECT: Direct LDL: 91 mg/dL

## 2018-06-30 LAB — PSA: PSA: 0.91 ng/mL (ref 0.10–4.00)

## 2018-06-30 LAB — HEMOGLOBIN A1C: Hgb A1c MFr Bld: 5.8 % (ref 4.6–6.5)

## 2018-06-30 MED ORDER — FLUTICASONE PROPIONATE 50 MCG/ACT NA SUSP: 1.0000 | Freq: Two times a day (BID) | NASAL | 6 refills | Status: DC

## 2018-06-30 NOTE — Patient Instructions (Signed)
A few things to remember from today's visit:   Routine general medical examination at a health care facility  Hyperlipidemia, mixed - Plan: Lipid panel  Elevated transaminase level  Diabetes mellitus screening - Plan: Hemoglobin A1c, Comprehensive metabolic panel  Prostate cancer screening - Plan: PSA(Must document that pt has been informed of limitations of PSA testing.)   At least 150 minutes of moderate exercise per week, daily brisk walking for 15-30 min is a good exercise option. Healthy diet low in saturated (animal) fats and sweets and consisting of fresh fruits and vegetables, lean meats such as fish and white chicken and whole grains.  - Vaccines:  Tdap vaccine every 10 years.  Shingles vaccine recommended at age 65, could be given after 50 years of age but not sure about insurance coverage.  Pneumonia vaccines:  Prevnar 13 at 65 and Pneumovax at 60.   -Screening recommendations for low/normal risk males:  Lipid screening at 35 and every 3 years. Screening starts in younger males with cardiovascular risk factors.  Colon cancer screening at age 70 and until age 32.  Prostate cancer screening: some controversy, starts usually at 50: Rectal exam and PSA.  Aortic Abdominal Aneurism once between 92 and 56 years old if ever smoker.  Also recommended:  1. Dental visit- Brush and floss your teeth twice daily; visit your dentist twice a year. 2. Eye doctor- Get an eye exam at least every 2 years. 3. Helmet use- Always wear a helmet when riding a bicycle, motorcycle, rollerblading or skateboarding. 4. Safe sex- If you may be exposed to sexually transmitted infections, use a condom. 5. Seat belts- Seat belts can save your live; always wear one. 6. Smoke/Carbon Monoxide detectors- These detectors need to be installed on the appropriate level of your home. Replace batteries at least once a year. 7. Skin cancer- When out in the sun please cover up and use sunscreen 15 SPF or  higher. 8. Violence- If anyone is threatening or hurting you, please tell your healthcare provider.  9. Drink alcohol in moderation- Limit alcohol intake to one drink or less per day. Never drink and drive.  Please be sure medication list is accurate. If a new problem present, please set up appointment sooner than planned today.

## 2018-07-02 ENCOUNTER — Other Ambulatory Visit: Payer: Self-pay | Admitting: Endocrinology

## 2018-07-02 DIAGNOSIS — E0590 Thyrotoxicosis, unspecified without thyrotoxic crisis or storm: Secondary | ICD-10-CM

## 2018-07-03 ENCOUNTER — Encounter: Payer: Self-pay | Admitting: Family Medicine

## 2018-07-19 ENCOUNTER — Other Ambulatory Visit: Payer: 59

## 2018-07-21 ENCOUNTER — Ambulatory Visit: Payer: 59 | Admitting: Endocrinology

## 2018-09-02 ENCOUNTER — Telehealth: Payer: Self-pay | Admitting: Endocrinology

## 2018-09-02 NOTE — Telephone Encounter (Signed)
Wife stopped by office to advise that the nuclear medicine scans from [place] [name]

## 2018-09-06 ENCOUNTER — Other Ambulatory Visit: Payer: 59

## 2018-09-07 ENCOUNTER — Other Ambulatory Visit (INDEPENDENT_AMBULATORY_CARE_PROVIDER_SITE_OTHER): Payer: 59

## 2018-09-07 DIAGNOSIS — E041 Nontoxic single thyroid nodule: Secondary | ICD-10-CM | POA: Diagnosis not present

## 2018-09-07 LAB — T3, FREE: T3 FREE: 4.4 pg/mL — AB (ref 2.3–4.2)

## 2018-09-07 LAB — T4, FREE: Free T4: 1.25 ng/dL (ref 0.60–1.60)

## 2018-09-07 LAB — TSH: TSH: <0.01 u[IU]/mL — ABNORMAL LOW (ref 0.35–4.50)

## 2018-09-09 ENCOUNTER — Telehealth: Payer: Self-pay | Admitting: Endocrinology

## 2018-09-09 ENCOUNTER — Ambulatory Visit (INDEPENDENT_AMBULATORY_CARE_PROVIDER_SITE_OTHER): Payer: 59 | Admitting: Endocrinology

## 2018-09-09 ENCOUNTER — Encounter: Payer: Self-pay | Admitting: Endocrinology

## 2018-09-09 VITALS — BP 142/76 | HR 88 | Ht 71.0 in | Wt 243.0 lb

## 2018-09-09 DIAGNOSIS — E0590 Thyrotoxicosis, unspecified without thyrotoxic crisis or storm: Secondary | ICD-10-CM

## 2018-09-09 MED ORDER — METOPROLOL SUCCINATE ER 25 MG PO TB24: 25.0000 mg | ORAL_TABLET | Freq: Every day | ORAL | 1 refills | Status: DC

## 2018-09-09 NOTE — Telephone Encounter (Signed)
Please contact nuclear medicine to see if they have received images from [place] for his thyroid scan.  These were submitted to [facility] service to upload this week.   We will need to see if the report can be available on his chart and after this will send treatment orders for radioactive iodine.  I had previously sent a message to 1 of the doctors to review this
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
## 2018-09-10 NOTE — Telephone Encounter (Signed)
Message from Dr. Bradly ChrisStroud: Apparently the CD is only showing thyroid ultrasound images and no nuclear scan.  I have ordered a nuclear imaging study in September, please schedule patient for this.  You may discuss this with patient's wife

## 2018-09-10 NOTE — Telephone Encounter (Signed)
appt scheduled and pt aware of appt dates and times

## 2018-09-10 NOTE — Telephone Encounter (Signed)
As in my note: This was dropped off at [facility].  She can talk to patient's wife to confirm.  Also have sent a message to Dr. Bradly ChrisStroud to review the films

## 2018-09-10 NOTE — Telephone Encounter (Signed)
[facility] and she needed to know where the CD was dropped of at, so lft pt a vm to return call

## 2018-09-27 ENCOUNTER — Encounter (HOSPITAL_COMMUNITY)
Admission: RE | Admit: 2018-09-27 | Discharge: 2018-09-27 | Disposition: A | Discharge status: Home / Self Care | Payer: 59 | Source: Ambulatory Visit | Attending: Endocrinology | Admitting: Endocrinology

## 2018-09-27 DIAGNOSIS — E0590 Thyrotoxicosis, unspecified without thyrotoxic crisis or storm: Secondary | ICD-10-CM | POA: Insufficient documentation

## 2018-09-27 MED ORDER — SODIUM IODIDE I-123 7.4 MBQ CAPS
443.0000 | ORAL_CAPSULE | Freq: Once | ORAL | Status: AC
  Administered 2018-09-27: 443 via ORAL

## 2018-09-28 ENCOUNTER — Other Ambulatory Visit: Payer: Self-pay | Admitting: Endocrinology

## 2018-09-28 ENCOUNTER — Encounter (HOSPITAL_COMMUNITY)
Admission: RE | Admit: 2018-09-28 | Discharge: 2018-09-28 | Disposition: A | Discharge status: Home / Self Care | Payer: 59 | Source: Ambulatory Visit | Attending: Endocrinology | Admitting: Endocrinology

## 2018-09-28 ENCOUNTER — Telehealth: Payer: Self-pay | Admitting: Endocrinology

## 2018-09-28 DIAGNOSIS — E0590 Thyrotoxicosis, unspecified without thyrotoxic crisis or storm: Secondary | ICD-10-CM | POA: Diagnosis not present

## 2018-09-28 DIAGNOSIS — E041 Nontoxic single thyroid nodule: Secondary | ICD-10-CM

## 2018-09-28 NOTE — Telephone Encounter (Signed)
Patient is calling for lab results. Please Advise, thanks

## 2018-09-28 NOTE — Progress Notes (Signed)
I-131 treatment, 30 mCi ordered for toxic thyroid nodule

## 2018-09-28 NOTE — Telephone Encounter (Signed)
Please see result note for his nuclear scan, his radioactive iodine treatment has been ordered for overactive left thyroid

## 2018-09-28 NOTE — Telephone Encounter (Signed)
Please advise 

## 2018-09-29 ENCOUNTER — Telehealth: Payer: Self-pay | Admitting: Endocrinology

## 2018-09-29 NOTE — Telephone Encounter (Signed)
Patient is calling back for lab results. Please advise.  °

## 2018-09-29 NOTE — Telephone Encounter (Signed)
Patient was calling to check status on referral for RAI- I got the patient scheduled for this and changed his f/u appointment with Dr. Lucianne MussKumar for one month after treatment with labs prior - this has been resolved

## 2018-09-30 ENCOUNTER — Telehealth: Payer: Self-pay | Admitting: Endocrinology

## 2018-09-30 NOTE — Telephone Encounter (Signed)
[facility] has called in regards to the referral they received for [id number] - [place] RAI THERAPY FOR HYPERTHYROIDISM. They stated it will need to be sent to the hospital, they do not do that in their office. Please Advise, thanks

## 2018-09-30 NOTE — Telephone Encounter (Signed)
This has been resolved

## 2018-09-30 NOTE — Telephone Encounter (Signed)
Called [facility] nuclear medicine and scheduled pt for RAI treatment on 10/08/18, and was informed that he had to be NPO after midnight and may possibly have to be isolated for 3-5 days per nuclear med scheduler. Pt verbalized understanding.

## 2018-10-08 ENCOUNTER — Encounter (HOSPITAL_COMMUNITY)
Admission: RE | Admit: 2018-10-08 | Discharge: 2018-10-08 | Disposition: A | Discharge status: Home / Self Care | Payer: 59 | Source: Ambulatory Visit | Attending: Endocrinology | Admitting: Endocrinology

## 2018-10-08 DIAGNOSIS — E041 Nontoxic single thyroid nodule: Secondary | ICD-10-CM | POA: Diagnosis present

## 2018-10-08 MED ORDER — SODIUM IODIDE I 131 CAPSULE
28.6000 | Freq: Once | INTRAVENOUS | Status: AC | PRN
  Administered 2018-10-08: 28.6 via ORAL

## 2018-10-25 ENCOUNTER — Other Ambulatory Visit: Payer: 59

## 2018-10-28 ENCOUNTER — Ambulatory Visit: Payer: 59 | Admitting: Endocrinology

## 2018-11-04 ENCOUNTER — Other Ambulatory Visit: Payer: Self-pay | Admitting: Endocrinology

## 2018-11-05 ENCOUNTER — Other Ambulatory Visit (INDEPENDENT_AMBULATORY_CARE_PROVIDER_SITE_OTHER): Payer: 59

## 2018-11-05 DIAGNOSIS — E0590 Thyrotoxicosis, unspecified without thyrotoxic crisis or storm: Secondary | ICD-10-CM | POA: Diagnosis not present

## 2018-11-05 LAB — T4, FREE: Free T4: 1.32 ng/dL (ref 0.60–1.60)

## 2018-11-05 LAB — T3, FREE: T3, Free: 4.8 pg/mL — ABNORMAL HIGH (ref 2.3–4.2)

## 2018-11-08 ENCOUNTER — Ambulatory Visit (INDEPENDENT_AMBULATORY_CARE_PROVIDER_SITE_OTHER): Payer: 59 | Admitting: Endocrinology

## 2018-11-08 ENCOUNTER — Encounter: Payer: Self-pay | Admitting: Endocrinology

## 2018-11-08 VITALS — BP 120/70 | HR 75 | Ht 71.0 in | Wt 248.4 lb

## 2018-11-08 DIAGNOSIS — R635 Abnormal weight gain: Secondary | ICD-10-CM

## 2018-11-08 DIAGNOSIS — E041 Nontoxic single thyroid nodule: Secondary | ICD-10-CM | POA: Diagnosis not present
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
## 2018-12-20 ENCOUNTER — Encounter: Payer: 59 | Attending: Family Medicine | Admitting: Skilled Nursing Facility1

## 2018-12-20 ENCOUNTER — Encounter: Payer: Self-pay | Admitting: Skilled Nursing Facility1

## 2018-12-20 DIAGNOSIS — E669 Obesity, unspecified: Secondary | ICD-10-CM | POA: Diagnosis present

## 2018-12-20 NOTE — Patient Instructions (Addendum)
-  While your son is at soccer; walk around the field ( a minimum of 3 days a week)  -Stop drinking soda and sweet tea  -Use measuring spoons to measure out your peanut butter and Nesquick   -Create balanced meals use your meal ideas sheet  -[employer]

## 2018-12-20 NOTE — Progress Notes (Signed)
  Assessment:  Primary concerns today: weight gain.   Pt states he Tried some elimination diets to determine where his flarups for his scleroderma are coming from: No meat, milk, pasta, bread, gluten for 3-4 months for each stating limiting gluten and alternative sugars helps his scleroderma feel better. Pt states in December he got radiation for his thyroid. Pt states his weight has gone up and down; gained 20 pounds; a year ago was losing weight fast down to 220 pounds and have been gaining weight since radiation gaining 15 pounds since December. Pt states he travels all the time. Pt states when he is out of town he drinks soda. Pt states sometimes he drinks soda for energy. Pt states he wears his C-PAP every night.  Pt had questions about how diet affects his thyroid disorder: Dietitian educated the pt on lifestyle/food choices having minimal to no effect on his having thyroid disorders.    MEDICATIONS: See List   DIETARY INTAKE:  Usual eating pattern includes 3 meals and 1 snacks per day.  Everyday foods include none stated.  Avoided foods include none stated.    24-hr recall:  B ( AM): 16 ounces chocolate nesquick; spoon of peanut butter or 2 eggs with tomato  onion pepper cooked in  Olive oil   Snk ( AM): 2 bottle of water  L ( PM): 3-4 pieces of ham, 1 cheese-white bread with activiia yogurt and fruit and sometimes cucumbers  Snk ( PM): D ( PM): restaurant: sweet potato, 8oz steak, soda,   Snk ( PM):  Beverages:  lactacid milk 2%, nesquick, soda, water: 67.6 oz   Usual physical activity: ADL's  Estimated energy needs: 1600 calories 180 g carbohydrates 120 g protein 44 g fat  Progress Towards Goal(s):  In progress.    Intervention:  Nutrition counseling. Dietitian educated the pt on eating healthily with the result of weight loss.  Goals: -While your son is at soccer; walk around the field ( a minimum of 3 days a week) -Stop drinking soda and sweet tea -Use measuring spoons  to measure out your peanut butter and Nesquick  -Create balanced meals use your meal ideas sheet -[employer] scheme manager  Teaching Method Utilized:  Visual Auditory Hands on  Handouts given during visit include: myplate Meal ideas  Barriers to learning/adherence to lifestyle change: none identified   Demonstrated degree of understanding via:  Teach Back   Monitoring/Evaluation:  Dietary intake, exercise, and body weight prn.

## 2018-12-23 ENCOUNTER — Other Ambulatory Visit (INDEPENDENT_AMBULATORY_CARE_PROVIDER_SITE_OTHER): Payer: 59

## 2018-12-23 ENCOUNTER — Other Ambulatory Visit: Payer: Self-pay

## 2018-12-23 DIAGNOSIS — E041 Nontoxic single thyroid nodule: Secondary | ICD-10-CM | POA: Diagnosis not present

## 2018-12-23 LAB — T4, FREE: Free T4: 1.15 ng/dL (ref 0.60–1.60)

## 2018-12-23 LAB — TSH

## 2018-12-23 LAB — T3, FREE: T3, Free: 4.6 pg/mL — ABNORMAL HIGH (ref 2.3–4.2)
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
## 2018-12-27 ENCOUNTER — Encounter: Payer: Self-pay | Admitting: Endocrinology

## 2018-12-27 ENCOUNTER — Ambulatory Visit (INDEPENDENT_AMBULATORY_CARE_PROVIDER_SITE_OTHER): Payer: 59 | Admitting: Endocrinology

## 2018-12-27 ENCOUNTER — Other Ambulatory Visit: Payer: Self-pay

## 2018-12-27 VITALS — BP 120/74 | HR 74 | Ht 71.0 in | Wt 251.8 lb

## 2018-12-27 DIAGNOSIS — E041 Nontoxic single thyroid nodule: Secondary | ICD-10-CM

## 2018-12-31 ENCOUNTER — Encounter: Payer: Self-pay | Admitting: Internal Medicine

## 2018-12-31 ENCOUNTER — Telehealth: Payer: Self-pay | Admitting: *Deleted

## 2018-12-31 ENCOUNTER — Other Ambulatory Visit: Payer: Self-pay

## 2018-12-31 ENCOUNTER — Ambulatory Visit (INDEPENDENT_AMBULATORY_CARE_PROVIDER_SITE_OTHER): Payer: 59 | Admitting: Internal Medicine

## 2018-12-31 VITALS — BP 102/62 | HR 73 | Temp 98.3°F | Wt 252.3 lb

## 2018-12-31 DIAGNOSIS — J069 Acute upper respiratory infection, unspecified: Secondary | ICD-10-CM

## 2018-12-31 DIAGNOSIS — B9789 Other viral agents as the cause of diseases classified elsewhere: Secondary | ICD-10-CM | POA: Diagnosis not present

## 2018-12-31 DIAGNOSIS — H6123 Impacted cerumen, bilateral: Secondary | ICD-10-CM
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
## 2018-12-31 NOTE — Patient Instructions (Addendum)
-  I hope you feel better soon!  -May use mucinex twice a day, delsym twice a day (cough) as well as a flu/sinus medication to help with pain and congestion.  -Come back to see [place] if not better in 10-14 days.  Dry cough, sore throat

## 2018-12-31 NOTE — Telephone Encounter (Signed)
Per Dr Ardyth Harps, I called the pt and asked pre-screeing questions for the Coronavirus.  Patient denies a fever, states he only traveled to [facility] 2 weeks ago via flight, denies cough, nor pneumonia, nor contact with anyone he is aware that has the virus.  Message forwarded to Dr Ardyth Harps as the pt has a 10am appt today.

## 2019-01-06 ENCOUNTER — Telehealth: Payer: Self-pay | Admitting: Family Medicine

## 2019-01-06 ENCOUNTER — Ambulatory Visit: Payer: Self-pay

## 2019-01-06 NOTE — Telephone Encounter (Signed)
Per infection prevention: no testing

## 2019-01-06 NOTE — Telephone Encounter (Signed)
Copied from CRM [id number]. Topic: Quick Communication - See Telephone Encounter >> Jan 06, 2019  9:32 AM Maia Petties wrote: CRM for notification. See Telephone encounter for: 01/06/19. Pt co-worker has tested positive for COVID19. Pt was last working along side this person 2/26 (last exposure).  Pt was in office 3/13. Pt coworker was tested 3/13 and got positive results this morning. 2 other coworkers at the conference 2/26 have tested positive as well but not sure of dates. They were in [place], [place] for the conference.  Pt asking if he should be tested.  Pt is not having cough/fever/sob at this time. At OV 3/13 pt had cough/sneezing/sore throat. Please advise.

## 2019-01-06 NOTE — Telephone Encounter (Signed)
Dr. Swaziland is out of the office.  Dr. Ardyth Harps - Based on pt's statements and risk factors please advise on approval to send order for mobile testing. Please advise. Thanks!

## 2019-01-06 NOTE — Telephone Encounter (Addendum)
,  Incoming call from Patient who states that he was at a conference with some one who tested positive for Covid -19.  On 12/31/18.  Patient   Has been sneezing and sneezing.   Denies fever  Denies temperature.  Provided care advice, voiced understanding.

## 2019-01-17 ENCOUNTER — Other Ambulatory Visit: Payer: Self-pay | Admitting: Endocrinology

## 2019-02-14 ENCOUNTER — Other Ambulatory Visit: Payer: Self-pay

## 2019-02-14 ENCOUNTER — Other Ambulatory Visit (INDEPENDENT_AMBULATORY_CARE_PROVIDER_SITE_OTHER): Payer: 59

## 2019-02-14 DIAGNOSIS — E041 Nontoxic single thyroid nodule: Secondary | ICD-10-CM

## 2019-02-14 LAB — T4, FREE: Free T4: 0.55 ng/dL — ABNORMAL LOW (ref 0.60–1.60)

## 2019-02-14 LAB — TSH: TSH: 4.97 u[IU]/mL — ABNORMAL HIGH (ref 0.35–4.50)

## 2019-02-15 LAB — T3: T3, Total: 100 ng/dL (ref 71–180)

## 2019-02-16 ENCOUNTER — Ambulatory Visit (INDEPENDENT_AMBULATORY_CARE_PROVIDER_SITE_OTHER): Payer: 59 | Admitting: Endocrinology

## 2019-02-16 ENCOUNTER — Encounter: Payer: Self-pay | Admitting: Endocrinology

## 2019-02-16 ENCOUNTER — Other Ambulatory Visit: Payer: Self-pay

## 2019-02-16 DIAGNOSIS — E890 Postprocedural hypothyroidism: Secondary | ICD-10-CM | POA: Diagnosis not present

## 2019-02-16 MED ORDER — LEVOTHYROXINE SODIUM 50 MCG PO TABS: 50.0000 ug | ORAL_TABLET | Freq: Every day | ORAL | 3 refills | Status: DC
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
## 2019-04-01 ENCOUNTER — Other Ambulatory Visit: Payer: Self-pay

## 2019-04-01 ENCOUNTER — Other Ambulatory Visit (INDEPENDENT_AMBULATORY_CARE_PROVIDER_SITE_OTHER): Payer: 59

## 2019-04-01 DIAGNOSIS — E890 Postprocedural hypothyroidism: Secondary | ICD-10-CM | POA: Diagnosis not present

## 2019-04-01 LAB — TSH: TSH: 3.3 u[IU]/mL (ref 0.35–4.50)

## 2019-04-01 LAB — T4, FREE: Free T4: 0.78 ng/dL (ref 0.60–1.60)

## 2019-04-06 ENCOUNTER — Other Ambulatory Visit: Payer: Self-pay

## 2019-04-06 ENCOUNTER — Encounter: Payer: Self-pay | Admitting: Endocrinology

## 2019-04-06 ENCOUNTER — Ambulatory Visit (INDEPENDENT_AMBULATORY_CARE_PROVIDER_SITE_OTHER): Payer: 59 | Admitting: Endocrinology

## 2019-04-06 DIAGNOSIS — E890 Postprocedural hypothyroidism: Secondary | ICD-10-CM | POA: Diagnosis not present
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
## 2019-05-25 ENCOUNTER — Other Ambulatory Visit: Payer: Self-pay

## 2019-05-25 MED ORDER — LEVOTHYROXINE SODIUM 50 MCG PO TABS: 50.0000 ug | ORAL_TABLET | Freq: Every day | ORAL | 3 refills | Status: DC

## 2019-07-01 ENCOUNTER — Other Ambulatory Visit: Payer: 59

## 2019-07-04 ENCOUNTER — Encounter: Payer: Self-pay | Admitting: Family Medicine

## 2019-07-04 ENCOUNTER — Ambulatory Visit: Payer: 59 | Admitting: Endocrinology

## 2019-07-25 ENCOUNTER — Other Ambulatory Visit: Payer: Self-pay

## 2019-07-25 ENCOUNTER — Other Ambulatory Visit (INDEPENDENT_AMBULATORY_CARE_PROVIDER_SITE_OTHER): Payer: 59

## 2019-07-25 DIAGNOSIS — E890 Postprocedural hypothyroidism: Secondary | ICD-10-CM | POA: Diagnosis not present

## 2019-07-25 LAB — T4, FREE: Free T4: 0.87 ng/dL (ref 0.60–1.60)

## 2019-07-25 LAB — TSH: TSH: 3.62 u[IU]/mL (ref 0.35–4.50)

## 2019-07-28 ENCOUNTER — Encounter: Payer: Self-pay | Admitting: Endocrinology

## 2019-07-28 ENCOUNTER — Ambulatory Visit (INDEPENDENT_AMBULATORY_CARE_PROVIDER_SITE_OTHER): Payer: 59 | Admitting: Endocrinology

## 2019-07-28 ENCOUNTER — Other Ambulatory Visit: Payer: Self-pay

## 2019-07-28 DIAGNOSIS — E890 Postprocedural hypothyroidism: Secondary | ICD-10-CM | POA: Diagnosis not present
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
## 2019-10-26 ENCOUNTER — Other Ambulatory Visit: Payer: Self-pay | Admitting: Endocrinology

## 2019-12-24 ENCOUNTER — Other Ambulatory Visit: Payer: Self-pay

## 2019-12-24 ENCOUNTER — Ambulatory Visit: Payer: 59 | Attending: Internal Medicine

## 2019-12-24 DIAGNOSIS — Z23 Encounter for immunization: Secondary | ICD-10-CM

## 2020-01-25 ENCOUNTER — Ambulatory Visit: Payer: 59 | Attending: Internal Medicine

## 2020-01-25 DIAGNOSIS — Z23 Encounter for immunization: Secondary | ICD-10-CM

## 2020-01-25 NOTE — Progress Notes (Signed)
   Covid-19 Vaccination Clinic  Name:  NEEL BUFFONE    MRN: [id number] DOB: Mar 10, 1966  01/25/2020  Mr. Kusek was observed post Covid-19 immunization for 15 minutes without incident. He was provided with Vaccine Information Sheet and instruction to access the V-Safe system.   Mr. Hank was instructed to call 911 with any severe reactions post vaccine: Marland Kitchen Difficulty breathing  . Swelling of face and throat  . A fast heartbeat  . A bad rash all over body  . Dizziness and weakness   Immunizations Administered    Name Date Dose VIS Date Route   Moderna COVID-19 Vaccine 01/25/2020  9:39 AM 0.5 mL 09/20/2019 Intramuscular   Manufacturer: Gala Murdoch   Lot: [id number]   NDC: 49179-150-56

## 2020-02-19 ENCOUNTER — Other Ambulatory Visit: Payer: Self-pay | Admitting: Endocrinology

## 2020-02-22 ENCOUNTER — Other Ambulatory Visit: Payer: 59

## 2020-02-22 ENCOUNTER — Other Ambulatory Visit (INDEPENDENT_AMBULATORY_CARE_PROVIDER_SITE_OTHER): Payer: 59

## 2020-02-22 DIAGNOSIS — E890 Postprocedural hypothyroidism: Secondary | ICD-10-CM

## 2020-02-22 LAB — TSH: TSH: 5.45 u[IU]/mL — ABNORMAL HIGH (ref 0.35–4.50)

## 2020-02-22 LAB — T4, FREE: Free T4: 0.89 ng/dL (ref 0.60–1.60)

## 2020-02-28 ENCOUNTER — Encounter: Payer: Self-pay | Admitting: Endocrinology

## 2020-02-28 ENCOUNTER — Ambulatory Visit (INDEPENDENT_AMBULATORY_CARE_PROVIDER_SITE_OTHER): Payer: 59 | Admitting: Endocrinology

## 2020-02-28 ENCOUNTER — Other Ambulatory Visit: Payer: Self-pay

## 2020-02-28 VITALS — BP 118/70 | HR 72 | Ht 71.0 in | Wt 247.0 lb

## 2020-02-28 DIAGNOSIS — E890 Postprocedural hypothyroidism: Secondary | ICD-10-CM | POA: Diagnosis not present

## 2020-02-28 MED ORDER — LEVOTHYROXINE SODIUM 75 MCG PO TABS: 75.0000 ug | ORAL_TABLET | Freq: Every day | ORAL | 3 refills | Status: DC
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
## 2020-02-28 NOTE — Patient Instructions (Signed)
1 1/2 tabs daily

## 2020-03-15 ENCOUNTER — Other Ambulatory Visit: Payer: Self-pay | Admitting: Endocrinology

## 2020-05-04 ENCOUNTER — Other Ambulatory Visit: Payer: Self-pay

## 2020-05-04 ENCOUNTER — Other Ambulatory Visit (INDEPENDENT_AMBULATORY_CARE_PROVIDER_SITE_OTHER): Payer: 59

## 2020-05-04 DIAGNOSIS — E890 Postprocedural hypothyroidism: Secondary | ICD-10-CM | POA: Diagnosis not present

## 2020-05-04 LAB — T4, FREE: Free T4: 1.01 ng/dL (ref 0.60–1.60)

## 2020-05-04 LAB — TSH: TSH: 3.13 u[IU]/mL (ref 0.35–4.50)

## 2020-05-10 ENCOUNTER — Ambulatory Visit (INDEPENDENT_AMBULATORY_CARE_PROVIDER_SITE_OTHER): Payer: 59 | Admitting: Endocrinology

## 2020-05-10 ENCOUNTER — Other Ambulatory Visit: Payer: Self-pay

## 2020-05-10 ENCOUNTER — Encounter: Payer: Self-pay | Admitting: Endocrinology

## 2020-05-10 VITALS — BP 110/70 | HR 65 | Ht 71.0 in | Wt 251.0 lb

## 2020-05-10 DIAGNOSIS — E890 Postprocedural hypothyroidism: Secondary | ICD-10-CM | POA: Diagnosis not present
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
## 2020-06-02 ENCOUNTER — Other Ambulatory Visit: Payer: Self-pay | Admitting: Endocrinology

## 2020-06-09 ENCOUNTER — Other Ambulatory Visit: Payer: Self-pay | Admitting: Endocrinology

## 2020-08-08 ENCOUNTER — Ambulatory Visit (INDEPENDENT_AMBULATORY_CARE_PROVIDER_SITE_OTHER): Payer: 59 | Admitting: Family Medicine

## 2020-08-08 ENCOUNTER — Other Ambulatory Visit: Payer: Self-pay

## 2020-08-08 ENCOUNTER — Encounter: Payer: Self-pay | Admitting: Family Medicine

## 2020-08-08 VITALS — BP 110/76 | HR 67 | Resp 16 | Ht 71.0 in | Wt 257.0 lb

## 2020-08-08 DIAGNOSIS — Z1211 Encounter for screening for malignant neoplasm of colon: Secondary | ICD-10-CM

## 2020-08-08 DIAGNOSIS — E782 Mixed hyperlipidemia: Secondary | ICD-10-CM | POA: Diagnosis not present

## 2020-08-08 DIAGNOSIS — Z13228 Encounter for screening for other metabolic disorders: Secondary | ICD-10-CM

## 2020-08-08 DIAGNOSIS — Z23 Encounter for immunization: Secondary | ICD-10-CM

## 2020-08-08 DIAGNOSIS — R7401 Elevation of levels of liver transaminase levels: Secondary | ICD-10-CM

## 2020-08-08 DIAGNOSIS — Z0000 Encounter for general adult medical examination without abnormal findings: Secondary | ICD-10-CM

## 2020-08-08 DIAGNOSIS — Z1329 Encounter for screening for other suspected endocrine disorder: Secondary | ICD-10-CM | POA: Diagnosis not present

## 2020-08-08 DIAGNOSIS — Z130 Encounter for screening for diseases of the blood and blood-forming organs and certain disorders involving the immune mechanism: Secondary | ICD-10-CM

## 2020-08-08 DIAGNOSIS — Z125 Encounter for screening for malignant neoplasm of prostate: Secondary | ICD-10-CM

## 2020-08-08 DIAGNOSIS — R413 Other amnesia: Secondary | ICD-10-CM
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
## 2020-08-08 NOTE — Patient Instructions (Addendum)
A few things to remember from today's visit:   Routine general medical examination at a health care facility  Hyperlipidemia, mixed - Plan: Lipid panel  Colon cancer screening - Plan: Ambulatory referral to Gastroenterology  Screening for endocrine, metabolic and immunity disorder - Plan: COMPLETE METABOLIC PANEL WITH GFR, Hemoglobin A1c  Memory problem - Plan: Vitamin B12  Prostate cancer screening - Plan: PSA  Please be sure medication list is accurate. If a new problem present, please set up appointment sooner than planned today.   At least 150 minutes of moderate exercise per week, daily brisk walking for 15-30 min is a good exercise option. Healthy diet low in saturated (animal) fats and sweets and consisting of fresh fruits and vegetables, lean meats such as fish and white chicken and whole grains.  - Vaccines:  Tdap vaccine every 10 years.  Shingles vaccine recommended at age 64, could be given after 52 years of age but not sure about insurance coverage.  Pneumonia vaccines: Pneumovax at 665   -Screening recommendations for low/normal risk males:  Screening for diabetes at age 19 and every 3 years. Earlier screening if cardiovascular risk factors.   Lipid screening at 35 and every 3 years. Screening starts in younger males with cardiovascular risk factors.N/A  Colon cancer screening is now at age 77 but your insurance may not cover until age 72 .screening is recommended age 27.  Prostate cancer screening: some controversy, starts usually at 50: Rectal exam and PSA.  Aortic Abdominal Aneurism once between 54 and 61 years old if ever smoker.  Also recommended:  1. Dental visit- Brush and floss your teeth twice daily; visit your dentist twice a year. 2. Eye doctor- Get an eye exam at least every 2 years. 3. Helmet use- Always wear a helmet when riding a bicycle, motorcycle, rollerblading or skateboarding. 4. Safe sex- If you may be exposed to sexually transmitted  infections, use a condom. 5. Seat belts- Seat belts can save your live; always wear one. 6. Smoke/Carbon Monoxide detectors- These detectors need to be installed on the appropriate level of your home. Replace batteries at least once a year. 7. Skin cancer- When out in the sun please cover up and use sunscreen 15 SPF or higher. 8. Violence- If anyone is threatening or hurting you, please tell your healthcare provider.  9. Drink alcohol in moderation- Limit alcohol intake to one drink or less per day. Never drink and drive.

## 2020-08-09 LAB — COMPLETE METABOLIC PANEL WITH GFR
AG Ratio: 1.5 (calc) (ref 1.0–2.5)
ALT: 100 U/L — ABNORMAL HIGH (ref 9–46)
AST: 79 U/L — ABNORMAL HIGH (ref 10–35)
Albumin: 4.4 g/dL (ref 3.6–5.1)
Alkaline phosphatase (APISO): 73 U/L (ref 35–144)
BUN: 13 mg/dL (ref 7–25)
CO2: 29 mmol/L (ref 20–32)
Calcium: 9.2 mg/dL (ref 8.6–10.3)
Chloride: 101 mmol/L (ref 98–110)
Creat: 0.98 mg/dL (ref 0.70–1.33)
GFR, Est African American: 102 mL/min/{1.73_m2} (ref >=60)
GFR, Est Non African American: 88 mL/min/{1.73_m2} (ref >=60)
Globulin: 3 g/dL (calc) (ref 1.9–3.7)
Glucose, Bld: 114 mg/dL — ABNORMAL HIGH (ref 65–99)
Potassium: 4.1 mmol/L (ref 3.5–5.3)
Sodium: 140 mmol/L (ref 135–146)
Total Bilirubin: 0.9 mg/dL (ref 0.2–1.2)
Total Protein: 7.4 g/dL (ref 6.1–8.1)

## 2020-08-09 LAB — VITAMIN B12: Vitamin B-12: 496 pg/mL (ref 200–1100)

## 2020-08-09 LAB — PSA: PSA: 0.71 ng/mL (ref <=4.0)

## 2020-08-09 LAB — LIPID PANEL
Cholesterol: 188 mg/dL (ref <200)
HDL: 41 mg/dL (ref >=40)
LDL Cholesterol (Calc): 101 mg/dL (calc) — ABNORMAL HIGH
Non-HDL Cholesterol (Calc): 147 mg/dL (calc) — ABNORMAL HIGH (ref <130)
Total CHOL/HDL Ratio: 4.6 (calc) (ref <5.0)
Triglycerides: 344 mg/dL — ABNORMAL HIGH (ref <150)

## 2020-08-09 LAB — HEMOGLOBIN A1C
Hgb A1c MFr Bld: 5.7 % of total Hgb — ABNORMAL HIGH (ref <5.7)
Mean Plasma Glucose: 117 (calc)
eAG (mmol/L): 6.5 (calc)

## 2020-08-12 ENCOUNTER — Encounter: Payer: Self-pay | Admitting: Family Medicine

## 2020-08-12 MED ORDER — OMEGA-3-ACID ETHYL ESTERS 1 G PO CAPS: 1.0000 g | ORAL_CAPSULE | Freq: Two times a day (BID) | ORAL | 2 refills | Status: DC

## 2020-08-20 ENCOUNTER — Ambulatory Visit (INDEPENDENT_AMBULATORY_CARE_PROVIDER_SITE_OTHER): Payer: 59 | Admitting: Family Medicine

## 2020-08-20 ENCOUNTER — Ambulatory Visit: Payer: 59 | Admitting: Family Medicine

## 2020-08-20 ENCOUNTER — Other Ambulatory Visit: Payer: Self-pay

## 2020-08-20 ENCOUNTER — Encounter: Payer: Self-pay | Admitting: Family Medicine

## 2020-08-20 ENCOUNTER — Ambulatory Visit (INDEPENDENT_AMBULATORY_CARE_PROVIDER_SITE_OTHER): Payer: 59

## 2020-08-20 VITALS — BP 118/74 | HR 75 | Temp 98.2°F | Resp 16 | Ht 71.0 in | Wt 255.5 lb

## 2020-08-20 DIAGNOSIS — R0989 Other specified symptoms and signs involving the circulatory and respiratory systems: Secondary | ICD-10-CM

## 2020-08-20 DIAGNOSIS — R0781 Pleurodynia: Secondary | ICD-10-CM

## 2020-08-20 DIAGNOSIS — K5900 Constipation, unspecified: Secondary | ICD-10-CM

## 2020-08-20 MED ORDER — TRAMADOL HCL 50 MG PO TABS: 50.0000 mg | ORAL_TABLET | Freq: Two times a day (BID) | ORAL | 0 refills | Status: AC | PRN

## 2020-08-20 NOTE — Patient Instructions (Addendum)
A few things to remember from today's visit:  Chest rales - Plan: DG Ribs Unilateral W/Chest Left  Costal margin pain - Plan: DG Ribs Unilateral W/Chest Left, traMADol (ULTRAM) 50 MG tablet  Muscle strain most likely, so it will require time to heal. Monitor for new symptoms. Avoid shallow breathing. If you need refills please call your pharmacy. Do not use My Chart to request refills or for acute issues that need immediate attention.    Please be sure medication list is accurate. If a new problem present, please set up appointment sooner than planned today.
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
## 2020-09-01 IMAGING — NM NM THYROID IMAGING W/ UPTAKE MULTI (4&24 HR)
4 series · 4 of 4 positions shown · non-contrast
Comparison: None

CLINICAL DATA: Neck swelling, goiter, hyperthyroidism, TSH

EXAM:
THYROID SCAN AND UPTAKE - 4 AND 24 HOURS
TECHNIQUE: Following oral administration of 9-9JP capsule, anterior planar
imaging was acquired at 24 hours. Thyroid uptake was calculated with
a thyroid probe at 4-6 hours and 24 hours.
RADIOPHARMACEUTICALS:  433 uCi 9-9JP sodium iodide p.o.

[iu thyroid uptake i123 · 3.10mm/px · 1 of 1 slices shown (1 of 4)]
[im 1/1]
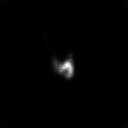

[iu thyroid uptake i123 · 3.10mm/px · 1 of 1 slices shown (2 of 4)]
[im 1/1]
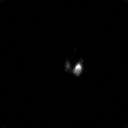

[iu thyroid uptake i123 · 3.10mm/px · 1 of 1 slices shown (3 of 4)]
[im 1/1]
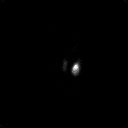

[iu thyroid uptake i123 · 3.10mm/px · 1 of 1 slices shown (4 of 4)]
[im 1/1]
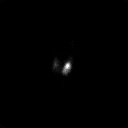

[4 of 4 positions shown; findings below may reference images not displayed]

FINDINGS: Hot nodule at mid to inferior pole of LEFT thyroid lobe.

Suppression of uptake throughout remaining thyroid tissue..

4 hour 9-9JP uptake = 13.9% (normal 5-20%)

24 hour 9-9JP uptake = 27.7% (normal 10-30%)
IMPRESSION: Hyperfunctional LEFT thyroid adenoma at mid inferior pole of LEFT
thyroid lobe with suppression of uptake throughout remaining thyroid
tissue.

Overall normal 4 hour and 24 hour radioiodine uptakes though the
majority of uptake is seen within the hot LEFT thyroid nodule.

## 2020-09-06 ENCOUNTER — Other Ambulatory Visit: Payer: Self-pay | Admitting: Endocrinology

## 2020-11-02 ENCOUNTER — Other Ambulatory Visit: Payer: Self-pay

## 2020-11-06 ENCOUNTER — Other Ambulatory Visit: Payer: 59

## 2020-11-07 ENCOUNTER — Other Ambulatory Visit: Payer: 59

## 2020-11-08 ENCOUNTER — Other Ambulatory Visit (INDEPENDENT_AMBULATORY_CARE_PROVIDER_SITE_OTHER): Payer: 59

## 2020-11-08 ENCOUNTER — Other Ambulatory Visit: Payer: Self-pay

## 2020-11-08 DIAGNOSIS — E890 Postprocedural hypothyroidism: Secondary | ICD-10-CM | POA: Diagnosis not present

## 2020-11-08 LAB — T4, FREE: Free T4: 0.94 ng/dL (ref 0.60–1.60)

## 2020-11-08 LAB — T3, FREE: T3, Free: 3.5 pg/mL (ref 2.3–4.2)

## 2020-11-08 LAB — TSH: TSH: 4.07 u[IU]/mL (ref 0.35–4.50)

## 2020-11-09 ENCOUNTER — Encounter: Payer: Self-pay | Admitting: Endocrinology

## 2020-11-09 ENCOUNTER — Ambulatory Visit (INDEPENDENT_AMBULATORY_CARE_PROVIDER_SITE_OTHER): Payer: 59 | Admitting: Endocrinology

## 2020-11-09 ENCOUNTER — Ambulatory Visit: Payer: 59 | Admitting: Endocrinology

## 2020-11-09 VITALS — BP 124/76 | HR 62 | Ht 71.0 in | Wt 255.6 lb

## 2020-11-09 DIAGNOSIS — R7301 Impaired fasting glucose: Secondary | ICD-10-CM

## 2020-11-09 DIAGNOSIS — E890 Postprocedural hypothyroidism: Secondary | ICD-10-CM | POA: Diagnosis not present

## 2020-11-09 MED ORDER — LEVOTHYROXINE SODIUM 88 MCG PO TABS: 88.0000 ug | ORAL_TABLET | Freq: Every day | ORAL | 3 refills | Status: DC
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
## 2021-02-12 ENCOUNTER — Other Ambulatory Visit: Payer: Self-pay | Admitting: *Deleted

## 2021-02-12 MED ORDER — LEVOTHYROXINE SODIUM 88 MCG PO TABS: 88.0000 ug | ORAL_TABLET | Freq: Every day | ORAL | 3 refills | Status: DC

## 2021-03-12 ENCOUNTER — Other Ambulatory Visit: Payer: 59

## 2021-03-14 ENCOUNTER — Ambulatory Visit: Payer: 59 | Admitting: Endocrinology

## 2021-05-26 ENCOUNTER — Other Ambulatory Visit: Payer: Self-pay | Admitting: Family Medicine
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
## 2021-08-13 ENCOUNTER — Encounter: Payer: Self-pay | Admitting: Family Medicine

## 2021-08-13 ENCOUNTER — Other Ambulatory Visit: Payer: Self-pay

## 2021-08-13 ENCOUNTER — Ambulatory Visit (INDEPENDENT_AMBULATORY_CARE_PROVIDER_SITE_OTHER): Payer: 59 | Admitting: Family Medicine

## 2021-08-13 VITALS — BP 124/80 | HR 75 | Resp 16 | Ht 71.0 in | Wt 253.0 lb

## 2021-08-13 DIAGNOSIS — E782 Mixed hyperlipidemia: Secondary | ICD-10-CM | POA: Diagnosis not present

## 2021-08-13 DIAGNOSIS — Z1329 Encounter for screening for other suspected endocrine disorder: Secondary | ICD-10-CM | POA: Diagnosis not present

## 2021-08-13 DIAGNOSIS — Z130 Encounter for screening for diseases of the blood and blood-forming organs and certain disorders involving the immune mechanism: Secondary | ICD-10-CM

## 2021-08-13 DIAGNOSIS — Z125 Encounter for screening for malignant neoplasm of prostate: Secondary | ICD-10-CM

## 2021-08-13 DIAGNOSIS — Z13228 Encounter for screening for other metabolic disorders: Secondary | ICD-10-CM | POA: Diagnosis not present

## 2021-08-13 DIAGNOSIS — M349 Systemic sclerosis, unspecified: Secondary | ICD-10-CM

## 2021-08-13 DIAGNOSIS — Z0000 Encounter for general adult medical examination without abnormal findings: Secondary | ICD-10-CM | POA: Diagnosis not present

## 2021-08-13 DIAGNOSIS — E538 Deficiency of other specified B group vitamins: Secondary | ICD-10-CM

## 2021-08-13 DIAGNOSIS — Z23 Encounter for immunization: Secondary | ICD-10-CM

## 2021-08-13 LAB — BASIC METABOLIC PANEL
BUN: 15 mg/dL (ref 6–23)
CO2: 28 mEq/L (ref 19–32)
Calcium: 9.4 mg/dL (ref 8.4–10.5)
Chloride: 102 mEq/L (ref 96–112)
Creatinine, Ser: 1.06 mg/dL (ref 0.40–1.50)
GFR: 80.4 mL/min (ref >60.00)
Glucose, Bld: 118 mg/dL — ABNORMAL HIGH (ref 70–99)
Potassium: 4.1 mEq/L (ref 3.5–5.1)
Sodium: 138 mEq/L (ref 135–145)

## 2021-08-13 LAB — HEPATIC FUNCTION PANEL
ALT: 95 U/L — ABNORMAL HIGH (ref 0–53)
AST: 68 U/L — ABNORMAL HIGH (ref 0–37)
Albumin: 4.5 g/dL (ref 3.5–5.2)
Alkaline Phosphatase: 68 U/L (ref 39–117)
Bilirubin, Direct: 0.2 mg/dL (ref 0.0–0.3)
Total Bilirubin: 1.2 mg/dL (ref 0.2–1.2)
Total Protein: 7.4 g/dL (ref 6.0–8.3)

## 2021-08-13 LAB — LIPID PANEL
Cholesterol: 168 mg/dL (ref 0–200)
HDL: 40.8 mg/dL (ref >39.00)
LDL Cholesterol: 87 mg/dL (ref 0–99)
NonHDL: 127.01
Total CHOL/HDL Ratio: 4
Triglycerides: 198 mg/dL — ABNORMAL HIGH (ref 0.0–149.0)
VLDL: 39.6 mg/dL (ref 0.0–40.0)

## 2021-08-13 LAB — VITAMIN B12: Vitamin B-12: 342 pg/mL (ref 211–911)

## 2021-08-13 LAB — HEMOGLOBIN A1C: Hgb A1c MFr Bld: 6.1 % (ref 4.6–6.5)

## 2021-08-13 LAB — PSA: PSA: 0.88 ng/mL (ref 0.10–4.00)

## 2021-08-13 MED ORDER — ICOSAPENT ETHYL 1 G PO CAPS: 2.0000 g | ORAL_CAPSULE | Freq: Two times a day (BID) | ORAL | 2 refills | Status: DC

## 2021-08-13 NOTE — Assessment & Plan Note (Signed)
Following with dermatology regularly.

## 2021-08-13 NOTE — Assessment & Plan Note (Signed)
Continua Lovaza 1 g 2 caps bid and low fat diet. Further recommendations according to FLP and LFT's results.

## 2021-08-13 NOTE — Patient Instructions (Addendum)
A few things to remember from today's visit:  Routine general medical examination at a health care facility  Hyperlipidemia, mixed - Plan: Hepatic function panel, Lipid panel  B12 deficiency - Plan: Vitamin B12  Prostate cancer screening - Plan: PSA(Must document that pt has been informed of limitations of PSA testing.)  Screening for endocrine, metabolic and immunity disorder - Plan: Basic metabolic panel, Hemoglobin A1c  If you need refills please call your pharmacy. Do not use My Chart to request refills or for acute issues that need immediate attention.    Please be sure medication list is accurate. If a new problem present, please set up appointment sooner than planned today.  Health Maintenance, Male Adopting a healthy lifestyle and getting preventive care are important in promoting health and wellness. Ask your health care provider about: The right schedule for you to have regular tests and exams. Things you can do on your own to prevent diseases and keep yourself healthy. What should I know about diet, weight, and exercise? Eat a healthy diet  Eat a diet that includes plenty of vegetables, fruits, low-fat dairy products, and lean protein. Do not eat a lot of foods that are high in solid fats, added sugars, or sodium. Maintain a healthy weight Body mass index (BMI) is a measurement that can be used to identify possible weight problems. It estimates body fat based on height and weight. Your health care provider can help determine your BMI and help you achieve or maintain a healthy weight. Get regular exercise Get regular exercise. This is one of the most important things you can do for your health. Most adults should: Exercise for at least 150 minutes each week. The exercise should increase your heart rate and make you sweat (moderate-intensity exercise). Do strengthening exercises at least twice a week. This is in addition to the moderate-intensity exercise. Spend less time  sitting. Even light physical activity can be beneficial. Watch cholesterol and blood lipids Have your blood tested for lipids and cholesterol at 53 years of age, then have this test every 5 years. You may need to have your cholesterol levels checked more often if: Your lipid or cholesterol levels are high. You are older than 53 years of age. You are at high risk for heart disease. What should I know about cancer screening? Many types of cancers can be detected early and may often be prevented. Depending on your health history and family history, you may need to have cancer screening at various ages. This may include screening for: Colorectal cancer. Prostate cancer. Skin cancer. Lung cancer. What should I know about heart disease, diabetes, and high blood pressure? Blood pressure and heart disease High blood pressure causes heart disease and increases the risk of stroke. This is more likely to develop in people who have high blood pressure readings, are of African descent, or are overweight. Talk with your health care provider about your target blood pressure readings. Have your blood pressure checked: Every 3-5 years if you are 45-66 years of age. Every year if you are 48 years old or older. If you are between the ages of 31 and 85 and are a current or former smoker, ask your health care provider if you should have a one-time screening for abdominal aortic aneurysm (AAA). Diabetes Have regular diabetes screenings. This checks your fasting blood sugar level. Have the screening done: Once every three years after age 93 if you are at a normal weight and have a low risk for diabetes.  More often and at a younger age if you are overweight or have a high risk for diabetes. What should I know about preventing infection? Hepatitis B If you have a higher risk for hepatitis B, you should be screened for this virus. Talk with your health care provider to find out if you are at risk for hepatitis B  infection. Hepatitis C Blood testing is recommended for: Everyone born from [birth year] through 1963. Anyone with known risk factors for hepatitis C. Sexually transmitted infections (STIs) You should be screened each year for STIs, including gonorrhea and chlamydia, if: You are sexually active and are younger than 52 years of age. You are older than 53 years of age and your health care provider tells you that you are at risk for this type of infection. Your sexual activity has changed since you were last screened, and you are at increased risk for chlamydia or gonorrhea. Ask your health care provider if you are at risk. Ask your health care provider about whether you are at high risk for HIV. Your health care provider may recommend a prescription medicine to help prevent HIV infection. If you choose to take medicine to prevent HIV, you should first get tested for HIV. You should then be tested every 3 months for as long as you are taking the medicine. Follow these instructions at home: Lifestyle Do not use any products that contain nicotine or tobacco, such as cigarettes, e-cigarettes, and chewing tobacco. If you need help quitting, ask your health care provider. Do not use street drugs. Do not share needles. Ask your health care provider for help if you need support or information about quitting drugs. Alcohol use Do not drink alcohol if your health care provider tells you not to drink. If you drink alcohol: Limit how much you have to 0-2 drinks a day. Be aware of how much alcohol is in your drink. In the [place] one drink equals one 12 oz bottle of beer (355 mL), one 5 oz glass of wine (148 mL), or one 1 oz glass of hard liquor (44 mL). General instructions Schedule regular health, dental, and eye exams. Stay current with your vaccines. Tell your health care provider if: You often feel depressed. You have ever been abused or do not feel safe at home. Summary Adopting a healthy lifestyle and  getting preventive care are important in promoting health and wellness. Follow your health care provider's instructions about healthy diet, exercising, and getting tested or screened for diseases. Follow your health care provider's instructions on monitoring your cholesterol and blood pressure. This information is not intended to replace advice given to you by your health care provider. Make sure you discuss any questions you have with your health care provider. Document Revised: 12/14/2020 Document Reviewed: 09/29/2018 Elsevier Patient Education  2022 [employer].

## 2022-02-25 ENCOUNTER — Other Ambulatory Visit: Payer: Self-pay | Admitting: Endocrinology

## 2022-07-10 ENCOUNTER — Ambulatory Visit (INDEPENDENT_AMBULATORY_CARE_PROVIDER_SITE_OTHER): Payer: 59 | Admitting: Adult Health

## 2022-07-10 ENCOUNTER — Encounter: Payer: Self-pay | Admitting: Adult Health

## 2022-07-10 ENCOUNTER — Other Ambulatory Visit: Payer: Self-pay

## 2022-07-10 ENCOUNTER — Encounter: Payer: Self-pay | Admitting: Physical Therapy

## 2022-07-10 ENCOUNTER — Ambulatory Visit: Payer: 59 | Attending: Orthopedic Surgery | Admitting: Physical Therapy

## 2022-07-10 VITALS — BP 120/100 | HR 70 | Temp 97.8°F | Ht 71.0 in | Wt 244.0 lb

## 2022-07-10 DIAGNOSIS — M5459 Other low back pain: Secondary | ICD-10-CM | POA: Insufficient documentation

## 2022-07-10 DIAGNOSIS — L0291 Cutaneous abscess, unspecified: Secondary | ICD-10-CM | POA: Diagnosis not present

## 2022-07-10 DIAGNOSIS — M5442 Lumbago with sciatica, left side: Secondary | ICD-10-CM | POA: Diagnosis not present

## 2022-07-10 DIAGNOSIS — R293 Abnormal posture: Secondary | ICD-10-CM | POA: Insufficient documentation

## 2022-07-10 LAB — BASIC METABOLIC PANEL
BUN: 19 mg/dL (ref 6–23)
CO2: 30 mEq/L (ref 19–32)
Calcium: 10 mg/dL (ref 8.4–10.5)
Chloride: 98 mEq/L (ref 96–112)
Creatinine, Ser: 1.11 mg/dL (ref 0.40–1.50)
GFR: 75.59 mL/min (ref >60.00)
Glucose, Bld: 178 mg/dL — ABNORMAL HIGH (ref 70–99)
Potassium: 4.2 mEq/L (ref 3.5–5.1)
Sodium: 136 mEq/L (ref 135–145)

## 2022-07-10 LAB — CBC WITH DIFFERENTIAL/PLATELET
Basophils Absolute: 0 10*3/uL (ref 0.0–0.1)
Basophils Relative: 0.5 % (ref 0.0–3.0)
Eosinophils Absolute: 0.1 10*3/uL (ref 0.0–0.7)
Eosinophils Relative: 0.7 % (ref 0.0–5.0)
HCT: 49 % (ref 39.0–52.0)
Hemoglobin: 16.7 g/dL (ref 13.0–17.0)
Lymphocytes Relative: 19.6 % (ref 12.0–46.0)
Lymphs Abs: 1.4 10*3/uL (ref 0.7–4.0)
MCHC: 34.2 g/dL (ref 30.0–36.0)
MCV: 91.1 fl (ref 78.0–100.0)
Monocytes Absolute: 0.6 10*3/uL (ref 0.1–1.0)
Monocytes Relative: 8.2 % (ref 3.0–12.0)
Neutro Abs: 5.1 10*3/uL (ref 1.4–7.7)
Neutrophils Relative %: 71 % (ref 43.0–77.0)
Platelets: 168 10*3/uL (ref 150.0–400.0)
RBC: 5.38 Mil/uL (ref 4.22–5.81)
RDW: 13.1 % (ref 11.5–15.5)
WBC: 7.2 10*3/uL (ref 4.0–10.5)

## 2022-07-10 MED ORDER — TRAMADOL HCL 50 MG PO TABS: 50.0000 mg | ORAL_TABLET | Freq: Three times a day (TID) | ORAL | 0 refills | Status: DC | PRN

## 2022-07-10 MED ORDER — DOXYCYCLINE HYCLATE 100 MG PO CAPS: 100.0000 mg | ORAL_CAPSULE | Freq: Two times a day (BID) | ORAL | 0 refills | Status: DC
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
## 2022-07-11 ENCOUNTER — Other Ambulatory Visit: Payer: Self-pay | Admitting: Adult Health

## 2022-07-11 ENCOUNTER — Telehealth: Payer: Self-pay | Admitting: Family Medicine

## 2022-07-11 ENCOUNTER — Other Ambulatory Visit: Payer: Self-pay

## 2022-07-11 ENCOUNTER — Telehealth: Payer: Self-pay

## 2022-07-11 DIAGNOSIS — L0291 Cutaneous abscess, unspecified: Secondary | ICD-10-CM

## 2022-07-11 DIAGNOSIS — M5442 Lumbago with sciatica, left side: Secondary | ICD-10-CM

## 2022-07-11 MED ORDER — TRAMADOL HCL 50 MG PO TABS: 50.0000 mg | ORAL_TABLET | Freq: Three times a day (TID) | ORAL | 0 refills | Status: AC | PRN

## 2022-07-11 MED ORDER — DOXYCYCLINE HYCLATE 100 MG PO CAPS: 100.0000 mg | ORAL_CAPSULE | Freq: Two times a day (BID) | ORAL | 0 refills | Status: DC

## 2022-07-11 NOTE — Telephone Encounter (Signed)
Rx sent in. Pt stated that [facility] stated their system was down and only had the Tramadol. Doxy sent in.

## 2022-07-11 NOTE — Telephone Encounter (Signed)
Rx sent in

## 2022-07-11 NOTE — Telephone Encounter (Signed)
Caller states she found the name of the medication that the nurse recommend but [facility] doesn't have it. The medication was tramadol 50mg  and the other one was doxycycline 100mg . Caller states he need a prescription for these two medications. Caller states that he needs the medication [facility].  07/10/2022 10:13:28 PM Clinical Call Donna Christen, RN, [facility] [place]

## 2022-07-11 NOTE — Telephone Encounter (Signed)
[facility] yesterday and they did not doxycycline (VIBRAMYCIN) 100 MG capsule. Pt seen cory yesterday  [id number] [place], [place] [facility] [facility] [address] Phone:  [phone]  Fax:  [phone]

## 2022-07-14 ENCOUNTER — Ambulatory Visit: Payer: 59

## 2022-07-14 DIAGNOSIS — M5459 Other low back pain: Secondary | ICD-10-CM

## 2022-07-14 DIAGNOSIS — R293 Abnormal posture: Secondary | ICD-10-CM
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
## 2022-07-15 ENCOUNTER — Other Ambulatory Visit: Payer: Self-pay | Admitting: Orthopedic Surgery

## 2022-07-15 DIAGNOSIS — M4316 Spondylolisthesis, lumbar region: Secondary | ICD-10-CM

## 2022-07-17 ENCOUNTER — Encounter: Payer: Self-pay | Admitting: *Deleted

## 2022-07-17 ENCOUNTER — Ambulatory Visit: Payer: 59 | Admitting: *Deleted

## 2022-07-17 DIAGNOSIS — M5459 Other low back pain: Secondary | ICD-10-CM

## 2022-07-17 DIAGNOSIS — R293 Abnormal posture: Secondary | ICD-10-CM
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
## 2022-07-21 ENCOUNTER — Encounter (HOSPITAL_COMMUNITY): Payer: Self-pay | Admitting: Emergency Medicine

## 2022-07-21 ENCOUNTER — Emergency Department (HOSPITAL_COMMUNITY)
Admission: EM | Admit: 2022-07-21 | Discharge: 2022-07-22 | Disposition: A | Discharge status: Home / Self Care | Payer: 59 | Attending: Emergency Medicine | Admitting: Emergency Medicine

## 2022-07-21 ENCOUNTER — Other Ambulatory Visit: Payer: Self-pay

## 2022-07-21 DIAGNOSIS — M79605 Pain in left leg: Secondary | ICD-10-CM | POA: Insufficient documentation

## 2022-07-21 DIAGNOSIS — M549 Dorsalgia, unspecified: Secondary | ICD-10-CM | POA: Diagnosis not present

## 2022-07-21 MED ORDER — OXYCODONE-ACETAMINOPHEN 5-325 MG PO TABS
1.0000 | ORAL_TABLET | Freq: Once | ORAL | Status: AC
  Administered 2022-07-21: 1 via ORAL
  Filled 2022-07-21: qty 1

## 2022-07-21 NOTE — ED Triage Notes (Signed)
Pt c/o back pain and left leg pain x 2 weeks, reports he has been seem by PCP and has been to PT with no relief; reports he has been given Gabapentin and tramadol with no relief; denies injury; pt reports he is supposed to have a MRI

## 2022-07-21 NOTE — ED Provider Triage Note (Signed)
Emergency Medicine Provider Triage Evaluation Note  Blake [name] , a 56 y.o. male  was evaluated in triage.  Pt complains of low back pain x2 weeks.  Pain in low back, radiating into left leg.  Saw PCP for this, has MRI scheduled for tomorrow but could not tolerate the pain any longer.  Denies any real change in pain, just uncontrolled.  No focal weakness, no incontinence.  No fever.  Has tried gabapentin and tramadol with PCP without relief.  Review of Systems  Positive: Back pain Negative: fever  Physical Exam  BP 129/84 (BP Location: Right Arm)   Pulse (!) 102   Temp 97.9 F (36.6 C) (Oral)   Resp 15   SpO2 97%  Gen:   Awake, no distress   Resp:  Normal effort  MSK:   Moves extremities without difficulty  Other:  + SLR on left, no focal strength/sensory deficit  Medical Decision Making  Medically screening exam initiated at 10:22 PM.  Appropriate orders placed.  Blake [name] was informed that the remainder of the evaluation will be completed by another provider, this initial triage assessment does not replace that evaluation, and the importance of remaining in the ED until their evaluation is complete.  Back pain.  Seems to be a lumbar radiculopathy.  No red flag symptoms noted in triage. Requesting pain control, has MRI scheduled for tomorrow at 2pm.   Larene Pickett, PA-C 07/21/22 2224

## 2022-07-22 ENCOUNTER — Telehealth: Payer: Self-pay

## 2022-07-22 ENCOUNTER — Encounter: Payer: 59 | Admitting: *Deleted

## 2022-07-22 ENCOUNTER — Ambulatory Visit
Admission: RE | Admit: 2022-07-22 | Discharge: 2022-07-22 | Disposition: A | Discharge status: Home / Self Care | Payer: 59 | Source: Ambulatory Visit | Attending: Orthopedic Surgery | Admitting: Orthopedic Surgery

## 2022-07-22 ENCOUNTER — Emergency Department (HOSPITAL_COMMUNITY): Payer: 59

## 2022-07-22 DIAGNOSIS — M4316 Spondylolisthesis, lumbar region: Secondary | ICD-10-CM

## 2022-07-22 MED ORDER — OXYCODONE-ACETAMINOPHEN 5-325 MG PO TABS
1.0000 | ORAL_TABLET | Freq: Once | ORAL | Status: AC
  Administered 2022-07-22: 1 via ORAL
  Filled 2022-07-22: qty 1

## 2022-07-22 MED ORDER — KETOROLAC TROMETHAMINE 60 MG/2ML IM SOLN
30.0000 mg | Freq: Once | INTRAMUSCULAR | Status: AC
  Administered 2022-07-22: 30 mg via INTRAMUSCULAR
  Filled 2022-07-22: qty 2

## 2022-07-22 MED ORDER — PREDNISONE 10 MG (21) PO TBPK: ORAL_TABLET | Freq: Every day | ORAL | 0 refills | Status: DC

## 2022-07-22 MED ORDER — HYDROCODONE-ACETAMINOPHEN 5-325 MG PO TABS: 2.0000 | ORAL_TABLET | Freq: Four times a day (QID) | ORAL | 0 refills | Status: DC | PRN

## 2022-07-22 NOTE — Telephone Encounter (Signed)
Transition Care Management Unsuccessful Follow-up Telephone Call  Date of discharge and from where:  07/22/22 from [facility] ED for left leg pain.  Attempts:  1st Attempt  Reason for unsuccessful TCM follow-up call:  Left voice message ; if pt returns call, please transfer to [place] [place].

## 2022-07-22 NOTE — Discharge Instructions (Signed)
Prescriptions were sent to your pharmacy.  The steroid taper can help limit inflammation and improve your pain.  You should also take ibuprofen and Tylenol to help with the pain.  If you have worsened pain despite the medications, a prescription for narcotic pain medication was sent to your pharmacy.  Take this with caution.  Ensure that you go to your MRI appointment today and follow-up with your outpatient providers.

## 2022-07-22 NOTE — ED Provider Notes (Signed)
[facility] EMERGENCY DEPARTMENT Provider Note   CSN: [id number] Arrival date & time: 07/21/22  2021     History  Chief Complaint  Patient presents with   Leg Pain    DURRELL BARAJAS is a 56 y.o. male.   Leg Pain Associated symptoms: back pain   Patient presents for left leg pain.  Medical history includes GERD, HLD, OSA, scleroderma.  He reports a 20-year history of chronic low back pain.  Over the past 2 weeks, he has developed pain that radiates into his proximal left leg, down to his knee.  This pain is worsened with range of motion and walking.  Due to the pain, he has began walking with a cane and a walker.  He denies any numbness or diminished sensation in his left leg.  He is followed by outpatient providers for these recent symptoms.  He has been prescribed gabapentin and tramadol.  These medications provide only minimal relief.  He is scheduled for an outpatient MRI this afternoon.  He presents to the ED due to worsened, uncontrolled pain.  He denies any recent injuries.  While in the ED waiting room, he was given Percocet and did have significant relief of his symptoms.    Home Medications Prior to Admission medications   Medication Sig Start Date End Date Taking? Authorizing Provider  HYDROcodone-acetaminophen (NORCO/VICODIN) 5-325 MG tablet Take 2 tablets by mouth every 6 (six) hours as needed. 07/22/22  Yes Gloris Manchester, MD  predniSONE (STERAPRED UNI-PAK 21 TAB) 10 MG (21) TBPK tablet Take by mouth daily. Take 6 tabs by mouth daily  for 2 days, then 5 tabs for 2 days, then 4 tabs for 2 days, then 3 tabs for 2 days, 2 tabs for 2 days, then 1 tab by mouth daily for 2 days 07/22/22  Yes Gloris Manchester, MD  doxycycline (VIBRAMYCIN) 100 MG capsule Take 1 capsule (100 mg total) by mouth 2 (two) times daily. 07/11/22   Nafziger, Kandee Keen, NP  gabapentin (NEURONTIN) 100 MG capsule Take 100 mg by mouth at bedtime. 06/20/22   [provider]  icosapent Ethyl (VASCEPA)  1 g capsule Take 2 capsules (2 g total) by mouth 2 (two) times daily. 08/13/21   [place], Betty G, MD  levothyroxine (SYNTHROID) 88 MCG tablet Take 1 tablet (88 mcg total) by mouth daily. 02/12/21   Reather Littler, MD      Allergies    Patient has no known allergies.    Review of Systems   Review of Systems  Musculoskeletal:  Positive for arthralgias, back pain and gait problem.  All other systems reviewed and are negative.   Physical Exam Updated Vital Signs BP (!) 140/93 (BP Location: Left Arm)   Pulse 65   Temp 98.1 F (36.7 C) (Oral)   Resp 16   Ht 5\' 11"  (1.803 m)   Wt 110.7 kg   SpO2 97%   BMI 34.03 kg/m  Physical Exam Vitals and nursing note reviewed.  Constitutional:      General: He is not in acute distress.    Appearance: Normal appearance. He is well-developed. He is not ill-appearing, toxic-appearing or diaphoretic.  HENT:     Head: Normocephalic and atraumatic.     Right Ear: External ear normal.     Left Ear: External ear normal.     Nose: Nose normal.     Mouth/Throat:     Mouth: Mucous membranes are moist.     Pharynx: Oropharynx is clear.  Eyes:     Extraocular Movements: Extraocular movements intact.     Conjunctiva/sclera: Conjunctivae normal.  Cardiovascular:     Rate and Rhythm: Normal rate and regular rhythm.     Heart sounds: No murmur heard. Pulmonary:     Effort: Pulmonary effort is normal. No respiratory distress.  Abdominal:     General: There is no distension.     Palpations: Abdomen is soft.  Musculoskeletal:        General: Tenderness present. No swelling or deformity.     Cervical back: Normal range of motion and neck supple.     Right lower leg: No edema.     Left lower leg: No edema.     Comments: Left hip and knee range of motion limited by pain.  Skin:    General: Skin is warm and dry.     Capillary Refill: Capillary refill takes less than 2 seconds.     Coloration: Skin is not jaundiced or pale.  Neurological:     General: No  focal deficit present.     Mental Status: He is alert and oriented to person, place, and time.     Cranial Nerves: No cranial nerve deficit.     Sensory: No sensory deficit.     Motor: No weakness.     Coordination: Coordination normal.  Psychiatric:        Mood and Affect: Mood normal.        Behavior: Behavior normal.        Thought Content: Thought content normal.        Judgment: Judgment normal.     ED Results / Procedures / Treatments   Labs (all labs ordered are listed, but only abnormal results are displayed) Labs Reviewed - No data to display  EKG None  Radiology No results found.  Procedures Procedures    Medications Ordered in ED Medications  oxyCODONE-acetaminophen (PERCOCET/ROXICET) 5-325 MG per tablet 1 tablet (1 tablet Oral Given 07/21/22 2229)  oxyCODONE-acetaminophen (PERCOCET/ROXICET) 5-325 MG per tablet 1 tablet (1 tablet Oral Given 07/22/22 1009)  ketorolac (TORADOL) injection 30 mg (30 mg Intramuscular Given 07/22/22 1008)    ED Course/ Medical Decision Making/ A&P                           Medical Decision Making Amount and/or Complexity of Data Reviewed Radiology: ordered.  Risk Prescription drug management.   This patient presents to the ED for concern of proximal left leg pain, this involves an extensive number of treatment options, and is a complaint that carries with it a high risk of complications and morbidity.  The differential diagnosis includes lumbar radiculopathy, occult hip injury, avascular necrosis, myositis   Co morbidities that complicate the patient evaluation  GERD, HLD, OSA, scleroderma   Additional history obtained:  Additional history obtained from patient's wife External records from outside source obtained and reviewed including EMR  Imaging Studies ordered:  I ordered imaging studies including x-ray of the left hip and knee I independently visualized and interpreted imaging which showed no acute findings I  agree with the radiologist interpretation   Problem List / ED Course / Critical interventions / Medication management  Patient is a pleasant 56 year old male presenting for ongoing pain of his proximal left leg over the past 2 weeks.  This pain has been worsening.  It is undertreated with gabapentin and tramadol at home.  Prior to being bedded in the ED, patient did  receive Percocet with relief of his symptoms.  At time of assessment, patient has had recurrence of pain.  He is well-appearing on exam.  On his left leg, I do not appreciate any swelling or muscle wasting.  There are no overlying skin changes to his areas of discomfort.  There is mild tenderness on the medial and lateral aspects of his left thigh.  Distal strength and sensation is intact.  His range of motion of left hip and left knee are limited by pain.  He has low back pain which she describes as chronic over the past 20 years and unchanged.  Patient has been being seen by his outpatient providers and does have an MRI scheduled for this afternoon.  He was given an additional dose of Percocet.  We will obtain x-ray imaging of left hip and left knee, which are his primary areas of discomfort.  X-ray imaging were without acute findings.  Patient's pain improved with Percocet and Toradol.  He was advised to take ibuprofen and Tylenol at home.  Additionally, he was prescribed a steroid Dosepak and 10 tablets of Norco to take as needed.  He was discharged in time to get to his MRI appointment. I ordered medication including Percocet and Toradol for analgesia Reevaluation of the patient after these medicines showed that the patient improved I have reviewed the patients home medicines and have made adjustments as needed   Social Determinants of Health:  Has access to outpatient care         Final Clinical Impression(s) / ED Diagnoses Final diagnoses:  Left leg pain    Rx / DC Orders ED Discharge Orders          Ordered     predniSONE (STERAPRED UNI-PAK 21 TAB) 10 MG (21) TBPK tablet  Daily        07/22/22 1158    HYDROcodone-acetaminophen (NORCO/VICODIN) 5-325 MG tablet  Every 6 hours PRN        07/22/22 1158              Godfrey Pick, MD 07/22/22 1214

## 2022-07-22 NOTE — Progress Notes (Signed)
While rounding I visited with patient and spoke with family at bedside. Pt is doing better. Chaplain provided  emotional and spiritual support and hospitality/nourishment to family. Chaplain available as needed.  Cristopher Peru, [facility], Pager [phone]

## 2022-07-23 NOTE — Telephone Encounter (Signed)
Transition Care Management Unsuccessful Follow-up Telephone Call  Date of discharge and from where:  07/22/22 from [facility] ED for pain in left leg  Attempts:  2nd Attempt  Reason for unsuccessful TCM follow-up call:  No answer/busy

## 2022-07-24 ENCOUNTER — Encounter: Payer: 59 | Admitting: Physical Therapy

## 2022-07-24 NOTE — Telephone Encounter (Signed)
Transition Care Management Unsuccessful Follow-up Telephone Call  Date of discharge and from where:  07/22/22 from [facility] for pain in left leg  Attempts:  3rd Attempt  Reason for unsuccessful TCM follow-up call:  Left voice message

## 2022-07-27 ENCOUNTER — Other Ambulatory Visit: Payer: 59

## 2022-07-31 ENCOUNTER — Encounter: Payer: 59 | Admitting: Physical Therapy

## 2022-08-04 ENCOUNTER — Encounter: Payer: 59 | Admitting: Physical Therapy

## 2022-08-15 ENCOUNTER — Other Ambulatory Visit: Payer: Self-pay
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
## 2022-08-19 ENCOUNTER — Ambulatory Visit (INDEPENDENT_AMBULATORY_CARE_PROVIDER_SITE_OTHER): Payer: 59 | Admitting: Family Medicine

## 2022-08-19 ENCOUNTER — Encounter: Payer: Self-pay | Admitting: Family Medicine

## 2022-08-19 VITALS — BP 128/70 | HR 71 | Temp 98.6°F | Resp 12 | Ht 71.0 in | Wt 241.1 lb

## 2022-08-19 DIAGNOSIS — Z125 Encounter for screening for malignant neoplasm of prostate: Secondary | ICD-10-CM

## 2022-08-19 DIAGNOSIS — E782 Mixed hyperlipidemia: Secondary | ICD-10-CM

## 2022-08-19 DIAGNOSIS — Z23 Encounter for immunization: Secondary | ICD-10-CM

## 2022-08-19 DIAGNOSIS — Z1329 Encounter for screening for other suspected endocrine disorder: Secondary | ICD-10-CM

## 2022-08-19 DIAGNOSIS — R7303 Prediabetes: Secondary | ICD-10-CM

## 2022-08-19 DIAGNOSIS — Z0000 Encounter for general adult medical examination without abnormal findings: Secondary | ICD-10-CM | POA: Diagnosis not present

## 2022-08-19 DIAGNOSIS — M349 Systemic sclerosis, unspecified: Secondary | ICD-10-CM | POA: Diagnosis not present

## 2022-08-19 DIAGNOSIS — R7401 Elevation of levels of liver transaminase levels: Secondary | ICD-10-CM | POA: Diagnosis not present

## 2022-08-19 DIAGNOSIS — E1169 Type 2 diabetes mellitus with other specified complication: Secondary | ICD-10-CM

## 2022-08-19 LAB — COMPREHENSIVE METABOLIC PANEL
ALT: 55 U/L — ABNORMAL HIGH (ref 0–53)
AST: 42 U/L — ABNORMAL HIGH (ref 0–37)
Albumin: 4.4 g/dL (ref 3.5–5.2)
Alkaline Phosphatase: 74 U/L (ref 39–117)
BUN: 17 mg/dL (ref 6–23)
CO2: 30 mEq/L (ref 19–32)
Calcium: 9.7 mg/dL (ref 8.4–10.5)
Chloride: 98 mEq/L (ref 96–112)
Creatinine, Ser: 0.92 mg/dL (ref 0.40–1.50)
GFR: 94.62 mL/min (ref >60.00)
Glucose, Bld: 121 mg/dL — ABNORMAL HIGH (ref 70–99)
Potassium: 4.2 mEq/L (ref 3.5–5.1)
Sodium: 137 mEq/L (ref 135–145)
Total Bilirubin: 1.5 mg/dL — ABNORMAL HIGH (ref 0.2–1.2)
Total Protein: 7.5 g/dL (ref 6.0–8.3)

## 2022-08-19 LAB — LIPID PANEL
Cholesterol: 153 mg/dL (ref 0–200)
HDL: 59.2 mg/dL (ref >39.00)
LDL Cholesterol: 72 mg/dL (ref 0–99)
NonHDL: 94.06
Total CHOL/HDL Ratio: 3
Triglycerides: 112 mg/dL (ref 0.0–149.0)
VLDL: 22.4 mg/dL (ref 0.0–40.0)

## 2022-08-19 LAB — HEMOGLOBIN A1C: Hgb A1c MFr Bld: 6.8 % — ABNORMAL HIGH (ref 4.6–6.5)

## 2022-08-19 LAB — PSA: PSA: 0.73 ng/mL (ref 0.10–4.00)

## 2022-08-19 MED ORDER — ACCU-CHEK AVIVA PLUS W/DEVICE KIT: PACK | 0 refills | Status: AC

## 2022-08-19 MED ORDER — CVS GLUCOSE METER TEST STRIPS VI STRP: ORAL_STRIP | 12 refills | Status: DC

## 2022-08-19 MED ORDER — ACCU-CHEK SOFTCLIX LANCETS MISC: 12 refills | Status: AC

## 2022-08-19 MED ORDER — ROSUVASTATIN CALCIUM 10 MG PO TABS: 10.0000 mg | ORAL_TABLET | ORAL | 3 refills | Status: DC

## 2022-08-19 NOTE — Addendum Note (Signed)
Addended by: [place], Demitrius Crass G on: 08/19/2022 05:35 PM   Modules accepted: Orders

## 2022-08-19 NOTE — Assessment & Plan Note (Signed)
Problem has been stable. He has not seen GI for this problem in a few years. Most likely related to fatty liver. We discussed some treatment options, for now low fat diet recommended. Further recommendations according to lab results.

## 2022-08-19 NOTE — Assessment & Plan Note (Addendum)
Stable. Continue following with dermatologist prn.

## 2022-08-19 NOTE — Addendum Note (Signed)
Addended by: [place], Jakita Dutkiewicz G on: 08/19/2022 06:01 PM   Modules accepted: Orders

## 2022-08-19 NOTE — Patient Instructions (Addendum)
A few things to remember from today's visit:  Routine general medical examination at a health care facility  Prostate cancer screening - Plan: PSA(Must document that pt has been informed of limitations of PSA testing.)  Hyperlipidemia, mixed - Plan: Comprehensive metabolic panel, Lipid panel  Scleredema (HCC), Chronic  Prediabetes - Plan: Comprehensive metabolic panel, Hemoglobin A1c  Elevated transaminase level - Plan: Comprehensive metabolic panel  If you need refills for medications you take chronically, please call your pharmacy. Do not use My Chart to request refills or for acute issues that need immediate attention. If you send a my chart message, it may take a few days to be addressed, specially if I am not in the office.  Please be sure medication list is accurate. If a new problem present, please set up appointment sooner than planned today.  Health Maintenance, Male Adopting a healthy lifestyle and getting preventive care are important in promoting health and wellness. Ask your health care provider about: The right schedule for you to have regular tests and exams. Things you can do on your own to prevent diseases and keep yourself healthy. What should I know about diet, weight, and exercise? Eat a healthy diet  Eat a diet that includes plenty of vegetables, fruits, low-fat dairy products, and lean protein. Do not eat a lot of foods that are high in solid fats, added sugars, or sodium. Maintain a healthy weight Body mass index (BMI) is a measurement that can be used to identify possible weight problems. It estimates body fat based on height and weight. Your health care provider can help determine your BMI and help you achieve or maintain a healthy weight. Get regular exercise Get regular exercise. This is one of the most important things you can do for your health. Most adults should: Exercise for at least 150 minutes each week. The exercise should increase your heart rate  and make you sweat (moderate-intensity exercise). Do strengthening exercises at least twice a week. This is in addition to the moderate-intensity exercise. Spend less time sitting. Even light physical activity can be beneficial. Watch cholesterol and blood lipids Have your blood tested for lipids and cholesterol at 54 years of age, then have this test every 5 years. You may need to have your cholesterol levels checked more often if: Your lipid or cholesterol levels are high. You are older than 54 years of age. You are at high risk for heart disease. What should I know about cancer screening? Many types of cancers can be detected early and may often be prevented. Depending on your health history and family history, you may need to have cancer screening at various ages. This may include screening for: Colorectal cancer. Prostate cancer. Skin cancer. Lung cancer. What should I know about heart disease, diabetes, and high blood pressure? Blood pressure and heart disease High blood pressure causes heart disease and increases the risk of stroke. This is more likely to develop in people who have high blood pressure readings or are overweight. Talk with your health care provider about your target blood pressure readings. Have your blood pressure checked: Every 3-5 years if you are 49-5 years of age. Every year if you are 82 years old or older. If you are between the ages of 77 and 52 and are a current or former smoker, ask your health care provider if you should have a one-time screening for abdominal aortic aneurysm (AAA). Diabetes Have regular diabetes screenings. This checks your fasting blood sugar level. Have the  screening done: Once every three years after age 72 if you are at a normal weight and have a low risk for diabetes. More often and at a younger age if you are overweight or have a high risk for diabetes. What should I know about preventing infection? Hepatitis B If you have a  higher risk for hepatitis B, you should be screened for this virus. Talk with your health care provider to find out if you are at risk for hepatitis B infection. Hepatitis C Blood testing is recommended for: Everyone born from [birth year] through 1963. Anyone with known risk factors for hepatitis C. Sexually transmitted infections (STIs) You should be screened each year for STIs, including gonorrhea and chlamydia, if: You are sexually active and are younger than 54 years of age. You are older than 54 years of age and your health care provider tells you that you are at risk for this type of infection. Your sexual activity has changed since you were last screened, and you are at increased risk for chlamydia or gonorrhea. Ask your health care provider if you are at risk. Ask your health care provider about whether you are at high risk for HIV. Your health care provider may recommend a prescription medicine to help prevent HIV infection. If you choose to take medicine to prevent HIV, you should first get tested for HIV. You should then be tested every 3 months for as long as you are taking the medicine. Follow these instructions at home: Alcohol use Do not drink alcohol if your health care provider tells you not to drink. If you drink alcohol: Limit how much you have to 0-2 drinks a day. Know how much alcohol is in your drink. In the [place] one drink equals one 12 oz bottle of beer (355 mL), one 5 oz glass of wine (148 mL), or one 1 oz glass of hard liquor (44 mL). Lifestyle Do not use any products that contain nicotine or tobacco. These products include cigarettes, chewing tobacco, and vaping devices, such as e-cigarettes. If you need help quitting, ask your health care provider. Do not use street drugs. Do not share needles. Ask your health care provider for help if you need support or information about quitting drugs. General instructions Schedule regular health, dental, and eye exams. Stay current  with your vaccines. Tell your health care provider if: You often feel depressed. You have ever been abused or do not feel safe at home. Summary Adopting a healthy lifestyle and getting preventive care are important in promoting health and wellness. Follow your health care provider's instructions about healthy diet, exercising, and getting tested or screened for diseases. Follow your health care provider's instructions on monitoring your cholesterol and blood pressure. This information is not intended to replace advice given to you by your health care provider. Make sure you discuss any questions you have with your health care provider. Document Revised: 02/25/2021 Document Reviewed: 02/25/2021 Elsevier Patient Education  [place].

## 2022-08-19 NOTE — Assessment & Plan Note (Signed)
Non pharmacologic treatment recommended for now. Further recommendations will be given according to 10 years CVD risk score and lipid panel numbers. 

## 2022-08-26 ENCOUNTER — Other Ambulatory Visit: Payer: Self-pay | Admitting: Endocrinology

## 2022-08-26 DIAGNOSIS — E890 Postprocedural hypothyroidism: Secondary | ICD-10-CM

## 2022-09-05 ENCOUNTER — Other Ambulatory Visit (INDEPENDENT_AMBULATORY_CARE_PROVIDER_SITE_OTHER): Payer: 59

## 2022-09-05 DIAGNOSIS — E890 Postprocedural hypothyroidism: Secondary | ICD-10-CM

## 2022-09-05 LAB — T4, FREE: Free T4: 1.15 ng/dL (ref 0.60–1.60)

## 2022-09-05 LAB — TSH: TSH: 3.19 u[IU]/mL (ref 0.35–5.50)

## 2022-09-16 ENCOUNTER — Ambulatory Visit: Payer: 59 | Admitting: Endocrinology

## 2022-09-23 ENCOUNTER — Encounter: Payer: 59 | Attending: Family Medicine | Admitting: Dietician

## 2022-09-23 VITALS — Ht 71.0 in | Wt 236.5 lb

## 2022-09-23 DIAGNOSIS — E119 Type 2 diabetes mellitus without complications: Secondary | ICD-10-CM | POA: Insufficient documentation

## 2022-09-23 NOTE — Patient Instructions (Addendum)
Aim for 150 minutes of physical activity weekly. Goal: Walk for 30 minutes 3x/wk.   Eat more Non-Starchy Vegetables  Goal: aim to make 1/2 of your plate vegetables at least 2x/day  Minimize added sugars and refined grains. Rethink what you drink. Choose beverages without added sugar. Look for 0 carbs on the label.  Choose whole foods over processed.  Make simple meals at home more often than eating out.  Aim to eat within 1-2 hours of waking up and every 3-5 hours following.   Recipe resource: https://www.hensley.biz/

## 2022-09-23 NOTE — Progress Notes (Signed)
Diabetes Self-Management Education  Visit Type: First/Initial  Appt. Start Time: 1035 Appt. End Time: 1140  09/23/2022  [name], identified by name and date of birth, is a 56 y.o. male with a diagnosis of Diabetes: Type 2.   ASSESSMENT  History includes: GERD, HLD, type 2 diabetes Labs noted: A1c 6.8% 08/19/22 Medications include: levothyroxine Supplements: none  Patient lives with wife and 2 kids. Pt is present today with his wife.   Pt was taking prednisone for 2-3 months before his surgery. Pt is concerned that this led to his diabetes diagnosis.   Pt walks 5000-6000 steps daily. Pt recently had back surgery and is cleared for walking only. He currently walks 2 days per week for 30 minutes but then his leg starts hurting.   Pt states he has made recent changes to his diet since his diabetes diagnosis.   Pt not currently taking diabetes medication and wants to manage with diet and lifestyle.   Height 5\' 11"  (1.803 m), weight 236 lb 8 oz (107.3 kg). Body mass index is 32.99 kg/m.   Diabetes Self-Management Education - 09/23/22 1038       Visit Information   Visit Type First/Initial      Initial Visit   Diabetes Type Type 2    Date Diagnosed 08/19/22    Are you currently following a meal plan? No    Are you taking your medications as prescribed? Not on Medications      Health Coping   How would you rate your overall health? Good      Psychosocial Assessment   Patient Belief/Attitude about Diabetes Motivated to manage diabetes    What is the hardest part about your diabetes right now, causing you the most concern, or is the most worrisome to you about your diabetes?   Making healty food and beverage choices    Self-care barriers None    Self-management support Doctor's office;Family    Other persons present Patient;Spouse/SO    Patient Concerns Healthy Lifestyle    Special Needs None    Preferred Learning Style No preference indicated    Learning  Readiness Ready    How often do you need to have someone help you when you read instructions, pamphlets, or other written materials from your doctor or pharmacy? 1 - Never    What is the last grade level you completed in school? bachelors      Pre-Education Assessment   Patient understands the diabetes disease and treatment process. Needs Instruction    Patient understands incorporating nutritional management into lifestyle. Needs Instruction    Patient undertands incorporating physical activity into lifestyle. Needs Instruction    Patient understands using medications safely. Needs Instruction    Patient understands monitoring blood glucose, interpreting and using results Needs Instruction    Patient understands prevention, detection, and treatment of acute complications. Needs Instruction    Patient understands prevention, detection, and treatment of chronic complications. Needs Instruction    Patient understands how to develop strategies to address psychosocial issues. Needs Instruction    Patient understands how to develop strategies to promote health/change behavior. Needs Instruction      Complications   Last HgB A1C per patient/outside source 6.8 %    How often do you check your blood sugar? 1-2 times/day    Fasting Blood glucose range (mg/dL) 08/21/22    Have you had a dilated eye exam in the past 12 months? No    Have you had a dental exam  in the past 12 months? Yes    Are you checking your feet? No      Dietary Intake   Breakfast eggs and keto bread and fruit    Snack (morning) peanuts or cashews    Lunch tuna salad sandwich OR chicken breast with sauteed peppers and onions with rice and beans    Snack (afternoon) SF jello OR nuts    Dinner steak and potatoes and vegetables    Snack (evening) none    Beverage(s) 2% lactose free milk with SF chocolate syrup, 100 oz water with crystal light      Activity / Exercise   Activity / Exercise Type Light (walking / raking leaves)     How many days per week do you exercise? 2    How many minutes per day do you exercise? 30    Total minutes per week of exercise 60      Patient Education   Previous Diabetes Education No    Disease Pathophysiology Definition of diabetes, type 1 and 2, and the diagnosis of diabetes;Factors that contribute to the development of diabetes;Explored patient's options for treatment of their diabetes    Healthy Eating Role of diet in the treatment of diabetes and the relationship between the three main macronutrients and blood glucose level;Plate Method;Reviewed blood glucose goals for pre and post meals and how to evaluate the patients' food intake on their blood glucose level.;Meal timing in regards to the patients' current diabetes medication.;Information on hints to eating out and maintain blood glucose control.;Meal options for control of blood glucose level and chronic complications.    Being Active Role of exercise on diabetes management, blood pressure control and cardiac health.;Identified with patient nutritional and/or medication changes necessary with exercise.;Helped patient identify appropriate exercises in relation to his/her diabetes, diabetes complications and other health issue.    Medications Reviewed medication adjustment guidelines for hyperglycemia and sick days.    Monitoring Identified appropriate SMBG and/or A1C goals.;Daily foot exams;Yearly dilated eye exam    Acute complications Taught prevention, symptoms, and  treatment of hypoglycemia - the 15 rule.;Discussed and identified patients' prevention, symptoms, and treatment of hyperglycemia.    Chronic complications Relationship between chronic complications and blood glucose control;Assessed and discussed foot care and prevention of foot problems;Lipid levels, blood glucose control and heart disease;Identified and discussed with patient  current chronic complications;Retinopathy and reason for yearly dilated eye exams;Dental  care;Nephropathy, what it is, prevention of, the use of ACE, ARB's and early detection of through urine microalbumia.;Reviewed with patient heart disease, higher risk of, and prevention    Diabetes Stress and Support Identified and addressed patients feelings and concerns about diabetes;Role of stress on diabetes;Worked with patient to identify barriers to care and solutions    Lifestyle and Health Coping Lifestyle issues that need to be addressed for better diabetes care      Individualized Goals (developed by patient)   Nutrition General guidelines for healthy choices and portions discussed    Physical Activity Exercise 3-5 times per week;30 minutes per day    Medications Not Applicable    Monitoring  Test my blood glucose as discussed    Problem Solving Eating Pattern    Reducing Risk examine blood glucose patterns;do foot checks daily;treat hypoglycemia with 15 grams of carbs if blood glucose less than 70mg /dL    Health Coping Ask for help with psychological, social, or emotional issues      Post-Education Assessment   Patient understands the diabetes disease and treatment  process. Comprehends key points    Patient understands incorporating nutritional management into lifestyle. Comprehends key points    Patient undertands incorporating physical activity into lifestyle. Comprehends key points    Patient understands using medications safely. N/A    Patient understands monitoring blood glucose, interpreting and using results Demonstrates understanding / competency    Patient understands prevention, detection, and treatment of acute complications. Comprehends key points    Patient understands prevention, detection, and treatment of chronic complications. Comprehends key points    Patient understands how to develop strategies to address psychosocial issues. Comprehends key points    Patient understands how to develop strategies to promote health/change behavior. Comprehends key points       Outcomes   Expected Outcomes Demonstrated interest in learning. Expect positive outcomes    Future DMSE 2 months    Program Status Not Completed             Individualized Plan for Diabetes Self-Management Training:   Learning Objective:  Patient will have a greater understanding of diabetes self-management. Patient education plan is to attend individual and/or group sessions per assessed needs and concerns.   Plan:   Patient Instructions  Aim for 150 minutes of physical activity weekly. Goal: Walk for 30 minutes 3x/wk.   Eat more Non-Starchy Vegetables  Goal: aim to make 1/2 of your plate vegetables at least 2x/day  Minimize added sugars and refined grains. Rethink what you drink. Choose beverages without added sugar. Look for 0 carbs on the label.  Choose whole foods over processed.  Make simple meals at home more often than eating out.  Aim to eat within 1-2 hours of waking up and every 3-5 hours following.   Recipe resource: https://www.hensley.biz/  Expected Outcomes:  Demonstrated interest in learning. Expect positive outcomes  Education material provided: ADA - How to Thrive: A Guide for Your Journey with Diabetes and Snack sheet  If problems or questions, patient to contact team via:  Phone  Future DSME appointment: 2 months

## 2022-10-02 ENCOUNTER — Other Ambulatory Visit (INDEPENDENT_AMBULATORY_CARE_PROVIDER_SITE_OTHER): Payer: 59

## 2022-10-02 DIAGNOSIS — E782 Mixed hyperlipidemia: Secondary | ICD-10-CM | POA: Diagnosis not present

## 2022-10-02 LAB — HEPATIC FUNCTION PANEL
ALT: 36 U/L (ref 0–53)
AST: 35 U/L (ref 0–37)
Albumin: 4.6 g/dL (ref 3.5–5.2)
Alkaline Phosphatase: 62 U/L (ref 39–117)
Bilirubin, Direct: 0.3 mg/dL (ref 0.0–0.3)
Total Bilirubin: 1.2 mg/dL (ref 0.2–1.2)
Total Protein: 7.7 g/dL (ref 6.0–8.3)
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
## 2022-10-15 ENCOUNTER — Ambulatory Visit (INDEPENDENT_AMBULATORY_CARE_PROVIDER_SITE_OTHER): Payer: 59 | Admitting: Endocrinology

## 2022-10-15 ENCOUNTER — Encounter: Payer: Self-pay | Admitting: Endocrinology

## 2022-10-15 VITALS — BP 144/82 | HR 57 | Ht 71.0 in | Wt 236.8 lb

## 2022-10-15 DIAGNOSIS — E890 Postprocedural hypothyroidism: Secondary | ICD-10-CM | POA: Diagnosis not present

## 2022-10-15 MED ORDER — LEVOTHYROXINE SODIUM 88 MCG PO TABS: 88.0000 ug | ORAL_TABLET | Freq: Every day | ORAL | 3 refills | Status: DC

## 2022-10-15 NOTE — Patient Instructions (Signed)
No change 
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
## 2022-11-21 ENCOUNTER — Ambulatory Visit (INDEPENDENT_AMBULATORY_CARE_PROVIDER_SITE_OTHER): Payer: 59 | Admitting: Family Medicine

## 2022-11-21 ENCOUNTER — Encounter: Payer: Self-pay | Admitting: Family Medicine

## 2022-11-21 VITALS — BP 120/68 | HR 60 | Temp 98.0°F | Resp 12 | Ht 71.0 in | Wt 233.1 lb

## 2022-11-21 DIAGNOSIS — Z23 Encounter for immunization: Secondary | ICD-10-CM | POA: Diagnosis not present

## 2022-11-21 DIAGNOSIS — E119 Type 2 diabetes mellitus without complications: Secondary | ICD-10-CM | POA: Insufficient documentation

## 2022-11-21 DIAGNOSIS — M349 Systemic sclerosis, unspecified: Secondary | ICD-10-CM

## 2022-11-21 DIAGNOSIS — E1169 Type 2 diabetes mellitus with other specified complication: Secondary | ICD-10-CM

## 2022-11-21 DIAGNOSIS — E782 Mixed hyperlipidemia: Secondary | ICD-10-CM | POA: Diagnosis not present

## 2022-11-21 LAB — MICROALBUMIN / CREATININE URINE RATIO
Creatinine,U: 122.5 mg/dL
Microalb Creat Ratio: 0.6 mg/g (ref 0.0–30.0)
Microalb, Ur: <0.7 mg/dL (ref 0.0–1.9)

## 2022-11-21 LAB — POCT GLYCOSYLATED HEMOGLOBIN (HGB A1C): Hemoglobin A1C: 5.4 % (ref 4.0–5.6)

## 2022-11-21 NOTE — Patient Instructions (Addendum)
A few things to remember from today's visit:  Type 2 diabetes mellitus with other specified complication, without long-term current use of insulin ([place]) - Plan: POC HgB A1c  Continue the good work! No cambios hoy. Lo veo en 6 meses.  If you need refills for medications you take chronically, please call your pharmacy. Do not use My Chart to request refills or for acute issues that need immediate attention. If you send a my chart message, it may take a few days to be addressed, specially if I am not in the office.  Please be sure medication list is accurate. If a new problem present, please set up appointment sooner than planned today.

## 2022-11-21 NOTE — Assessment & Plan Note (Signed)
Last LDL 72 in 07/2022. Continue Rosuvastatin 10 mg daily and low fat diet. Will plan on checking FLP next visit.

## 2022-11-21 NOTE — Assessment & Plan Note (Signed)
HgA1C at goal, it went from 6.8 to 5.4. Continue non pharmacologic treatment. Regular exercise and healthy diet with avoidance of added sugar food intake is an important part of treatment and recommended. Annual eye exam, periodic dental and foot care recommended. F/U in 5-6 months.

## 2022-11-21 NOTE — Assessment & Plan Note (Signed)
He has noted skin improvement, no new involvement. He is following with dermatologist prn.

## 2022-12-04 ENCOUNTER — Encounter: Payer: Self-pay | Admitting: Dietician

## 2022-12-04 ENCOUNTER — Encounter: Payer: 59 | Attending: Family Medicine | Admitting: Dietician

## 2022-12-04 VITALS — Wt 233.0 lb

## 2022-12-04 DIAGNOSIS — E119 Type 2 diabetes mellitus without complications: Secondary | ICD-10-CM | POA: Diagnosis present

## 2022-12-04 NOTE — Patient Instructions (Addendum)
Goal: Walk for 30 minutes 5x/wk  Goal: aim to make 1/2 of your plate vegetables at least 2x/day  Tips Minimize added sugars and refined grains. Rethink what you drink. Choose beverages without added sugar. Look for 0 carbs on the label. Choose whole foods over processed. Make simple meals at home more often than eating out. Aim to eat within 1-2 hours of waking up and every 3-5 hours following.

## 2022-12-04 NOTE — Progress Notes (Signed)
Diabetes Self-Management Education  Visit Type: Follow-up  Appt. Start Time: 0845 Appt. End Time: 0910  12/04/2022  [name], identified by name and date of birth, is a 57 y.o. male with a diagnosis of Diabetes: Type 2.   ASSESSMENT   History includes: GERD, HLD, type 2 diabetes Labs noted: A1c 5.4% on 11/21/22, down from A1c 6.8% 08/19/22 Medications include: levothyroxine Supplements: none   Pt's A1c has dropped from 6.8 to 5.4 since last visit.   Pt states he increased his vegetable intake and water intake.   Pt states he doesn't eat after 8:30pm and finds that this helps him sleep better.  Pt states he feels healthy and has more energy throughout the day and evening.   Pt reports he drinks 100-120 oz water daily. Pt states sometimes he uses crystal light to flavor his water.   Pt walks 7000 steps daily. Pt walks 30 minutes 5-6 days per week.   Pt checks his blood glucose every morning and it is usually between 100-163m/dL.   Weight 233 lb (105.7 kg). Body mass index is 32.5 kg/m.   Diabetes Self-Management Education - 12/04/22 0844       Visit Information   Visit Type Follow-up      Initial Visit   Diabetes Type Type 2    Date Diagnosed 08/19/22    Are you currently following a meal plan? No    Are you taking your medications as prescribed? Not on Medications      Psychosocial Assessment   Patient Belief/Attitude about Diabetes Motivated to manage diabetes    What is the hardest part about your diabetes right now, causing you the most concern, or is the most worrisome to you about your diabetes?   Making healty food and beverage choices    Self-care barriers None    Self-management support Doctor's office    Other persons present Patient    Patient Concerns Nutrition/Meal planning    Special Needs None    Preferred Learning Style No preference indicated    Learning Readiness Ready    How often do you need to have someone help you when you read  instructions, pamphlets, or other written materials from your doctor or pharmacy? 1 - Never    What is the last grade level you completed in school? bachelors      Pre-Education Assessment   Patient understands the diabetes disease and treatment process. Needs Review    Patient understands incorporating nutritional management into lifestyle. Needs Review    Patient undertands incorporating physical activity into lifestyle. Needs Review    Patient understands using medications safely. Needs Review    Patient understands monitoring blood glucose, interpreting and using results Needs Review    Patient understands prevention, detection, and treatment of acute complications. Needs Review    Patient understands prevention, detection, and treatment of chronic complications. Needs Review    Patient understands how to develop strategies to address psychosocial issues. Needs Review    Patient understands how to develop strategies to promote health/change behavior. Needs Review      Complications   Last HgB A1C per patient/outside source 5.4 %    How often do you check your blood sugar? 1-2 times/day    Fasting Blood glucose range (mg/dL) 70-129    Are you checking your feet? Yes      Dietary Intake   Breakfast eggs and milk OR green juice    Snack (morning) nuts and greek yogurt  Lunch protein, carb and veggies    Dinner fish or chicken and avocado and cucumber    Snack (evening) none    Beverage(s) water, occasional juice,      Activity / Exercise   Activity / Exercise Type Light (walking / raking leaves)    How many days per week do you exercise? 5    How many minutes per day do you exercise? 30    Total minutes per week of exercise 150      Patient Education   Previous Diabetes Education Yes (please comment)    Disease Pathophysiology Explored patient's options for treatment of their diabetes    Healthy Eating Role of diet in the treatment of diabetes and the relationship between the  three main macronutrients and blood glucose level;Plate Method;Reviewed blood glucose goals for pre and post meals and how to evaluate the patients' food intake on their blood glucose level.;Meal timing in regards to the patients' current diabetes medication.;Meal options for control of blood glucose level and chronic complications.    Being Active Role of exercise on diabetes management, blood pressure control and cardiac health.;Helped patient identify appropriate exercises in relation to his/her diabetes, diabetes complications and other health issue.    Medications Other (comment)    Monitoring Identified appropriate SMBG and/or A1C goals.;Daily foot exams;Yearly dilated eye exam    Chronic complications Relationship between chronic complications and blood glucose control;Identified and discussed with patient  current chronic complications    Diabetes Stress and Support Identified and addressed patients feelings and concerns about diabetes    Lifestyle and Health Coping Lifestyle issues that need to be addressed for better diabetes care      Individualized Goals (developed by patient)   Nutrition General guidelines for healthy choices and portions discussed    Physical Activity Exercise 5-7 days per week;30 minutes per day    Medications Not Applicable    Monitoring  Test my blood glucose as discussed    Problem Solving Eating Pattern    Reducing Risk examine blood glucose patterns;do foot checks daily;treat hypoglycemia with 15 grams of carbs if blood glucose less than 62m/dL    Health Coping Ask for help with psychological, social, or emotional issues      Patient Self-Evaluation of Goals - Patient rates self as meeting previously set goals (% of time)   Nutrition >75% (most of the time)    Physical Activity >75% (most of the time)    Medications Not Applicable    Monitoring 50 - 75 % (half of the time)    Problem Solving and behavior change strategies  50 - 75 % (half of the time)     Reducing Risk (treating acute and chronic complications) >[id number](most of the time)    Health Coping 50 - 75 % (half of the time)      Post-Education Assessment   Patient understands the diabetes disease and treatment process. Comprehends key points    Patient understands incorporating nutritional management into lifestyle. Demonstrates understanding / competency    Patient undertands incorporating physical activity into lifestyle. Demonstrates understanding / competency    Patient understands using medications safely. N/A    Patient understands monitoring blood glucose, interpreting and using results Demonstrates understanding / competency    Patient understands prevention, detection, and treatment of acute complications. Comprehends key points    Patient understands prevention, detection, and treatment of chronic complications. Comprehends key points    Patient understands how to develop strategies to address psychosocial  issues. Comprehends key points    Patient understands how to develop strategies to promote health/change behavior. Comprehends key points      Outcomes   Expected Outcomes Demonstrated interest in learning. Expect positive outcomes    Future DMSE 6 months    Program Status Not Completed      Subsequent Visit   Since your last visit have you continued or begun to take your medications as prescribed? Not on Medications    Since your last visit have you had your blood pressure checked? Yes    Is your most recent blood pressure lower, unchanged, or higher since your last visit? Unchanged    Since your last visit have you experienced any weight changes? Loss    Weight Loss (lbs) 3    Since your last visit, are you checking your blood glucose at least once a day? Yes             Individualized Plan for Diabetes Self-Management Training:   Learning Objective:  Patient will have a greater understanding of diabetes self-management. Patient education plan is to attend  individual and/or group sessions per assessed needs and concerns.   Plan:   Patient Instructions  Goal: Walk for 30 minutes 5x/wk  Goal: aim to make 1/2 of your plate vegetables at least 2x/day  Tips Minimize added sugars and refined grains. Rethink what you drink. Choose beverages without added sugar. Look for 0 carbs on the label. Choose whole foods over processed. Make simple meals at home more often than eating out. Aim to eat within 1-2 hours of waking up and every 3-5 hours following.   Expected Outcomes:  Demonstrated interest in learning. Expect positive outcomes  Education material provided: Snack sheet, Meal Ideas  If problems or questions, patient to contact team via:  Phone  Future DSME appointment: 6 months

## 2023-02-02 ENCOUNTER — Other Ambulatory Visit: Payer: Self-pay

## 2023-02-02 DIAGNOSIS — E1169 Type 2 diabetes mellitus with other specified complication: Secondary | ICD-10-CM

## 2023-02-02 MED ORDER — CVS GLUCOSE METER TEST STRIPS VI STRP: ORAL_STRIP | 12 refills | Status: AC
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
## 2023-05-20 ENCOUNTER — Ambulatory Visit (INDEPENDENT_AMBULATORY_CARE_PROVIDER_SITE_OTHER): Payer: 59 | Admitting: Family Medicine

## 2023-05-20 VITALS — BP 110/70 | HR 55 | Temp 98.1°F | Resp 12 | Ht 71.0 in | Wt 231.2 lb

## 2023-05-20 DIAGNOSIS — E1169 Type 2 diabetes mellitus with other specified complication: Secondary | ICD-10-CM

## 2023-05-20 DIAGNOSIS — E782 Mixed hyperlipidemia: Secondary | ICD-10-CM

## 2023-05-20 LAB — POCT GLYCOSYLATED HEMOGLOBIN (HGB A1C): Hemoglobin A1C: 5.5 % (ref 4.0–5.6)

## 2023-05-20 NOTE — Assessment & Plan Note (Signed)
HgA1C at goal, it went from 6.4 to 6.5. Continue non pharmacologic treatment. Annual eye exam recommended and continue periodic dental and foot care. F/U in 5-6 months.

## 2023-05-20 NOTE — Patient Instructions (Addendum)
A few things to remember from today's visit:  Type 2 diabetes mellitus with other specified complication, without long-term current use of insulin (HCC) - Plan: POC HgB A1c  Hyperlipidemia, mixed Rosuvastatin is for cholesterol. Fasting labs next visit. No changes today.  If you need refills for medications you take chronically, please call your pharmacy. Do not use My Chart to request refills or for acute issues that need immediate attention. If you send a my chart message, it may take a few days to be addressed, specially if I am not in the office.  Please be sure medication list is accurate. If a new problem present, please set up appointment sooner than planned today.

## 2023-05-20 NOTE — Assessment & Plan Note (Signed)
We discussed CV benefits of statins. Last LDL 72 in 07/2022. Continue rosuvastatin 10 mg daily. We will plan on fasting labs next visit.

## 2023-06-04 ENCOUNTER — Encounter: Payer: Self-pay | Admitting: Dietician

## 2023-06-04 ENCOUNTER — Encounter: Payer: 59 | Attending: Family Medicine | Admitting: Dietician

## 2023-06-04 VITALS — Wt 233.0 lb

## 2023-06-04 DIAGNOSIS — E119 Type 2 diabetes mellitus without complications: Secondary | ICD-10-CM | POA: Insufficient documentation

## 2023-06-04 NOTE — Progress Notes (Signed)
Diabetes Self-Management Education  Visit Type: Follow-up  Appt. Start Time: 0735 Appt. End Time: 0800  06/04/2023  [name], identified by name and date of birth, is a 57 y.o. male with a diagnosis of Diabetes: type 2  .   ASSESSMENT  History includes: GERD, HLD, type 2 diabetes Labs noted: A1c 5.5% 05/20/23, A1c 5.4% on 11/21/22, down from A1c 6.8% 08/19/22 Medications include: levothyroxine Supplements: none  Pt states his A1c has stayed relatively the same since previous visit.   Pt states he has been cramping at night, and he was told to drink more water but he already drinks around 100 oz of water. He states he started using the stationary bike and he feels it could be due to that.   Pt states he has better energy throughout the day and he also notices he has been sleeping better. He states he goes to bed at 10:30pm, and has stopped eating after 8:30pm.   Pt states sometimes he doesn't have time to eat lunch so he has a snack such as nuts, cheese, and vegetables.  Previous Goals:  Goal: Walk for 30 minutes 5x/wk - goal met, continue   Goal: aim to make 1/2 of your plate vegetables at least 2x/day - goal in progress, continue  Weight 233 lb (105.7 kg). Body mass index is 32.5 kg/m.   Diabetes Self-Management Education - 06/04/23 0738       Visit Information   Visit Type Follow-up      Health Coping   How would you rate your overall health? Good      Psychosocial Assessment   Patient Belief/Attitude about Diabetes Motivated to manage diabetes    What is the hardest part about your diabetes right now, causing you the most concern, or is the most worrisome to you about your diabetes?   Making healty food and beverage choices    Self-care barriers None    Self-management support Doctor's office    Other persons present Patient    Patient Concerns Nutrition/Meal planning    Special Needs None    Preferred Learning Style No preference indicated    Learning  Readiness Ready      Pre-Education Assessment   Patient understands the diabetes disease and treatment process. Needs Review    Patient understands incorporating nutritional management into lifestyle. Needs Review    Patient undertands incorporating physical activity into lifestyle. Needs Review    Patient understands using medications safely. Needs Review    Patient understands monitoring blood glucose, interpreting and using results Needs Review    Patient understands prevention, detection, and treatment of acute complications. Needs Review    Patient understands prevention, detection, and treatment of chronic complications. Needs Review    Patient understands how to develop strategies to address psychosocial issues. Needs Review    Patient understands how to develop strategies to promote health/change behavior. Needs Review      Complications   Last HgB A1C per patient/outside source 5.5 %      Dietary Intake   Breakfast eggs and toast and avocado and cheese    Snack (morning) pack of nuts    Lunch chicken and salad and rice OR veggies and cheese    Snack (afternoon) apple and peanut butter    Dinner rice and beans with meat OR colombian soup    Snack (evening) occasional ice cream    Beverage(s) 100 oz water      Activity / Exercise   Activity / Exercise  Type Light (walking / raking leaves)    How many days per week do you exercise? 6    How many minutes per day do you exercise? 30    Total minutes per week of exercise 180      Patient Education   Previous Diabetes Education Yes (please comment)    Disease Pathophysiology Explored patient's options for treatment of their diabetes    Healthy Eating Role of diet in the treatment of diabetes and the relationship between the three main macronutrients and blood glucose level;Plate Method;Meal timing in regards to the patients' current diabetes medication.;Information on hints to eating out and maintain blood glucose control.     Being Active Role of exercise on diabetes management, blood pressure control and cardiac health.    Chronic complications Relationship between chronic complications and blood glucose control;Identified and discussed with patient  current chronic complications    Diabetes Stress and Support Identified and addressed patients feelings and concerns about diabetes    Lifestyle and Health Coping Lifestyle issues that need to be addressed for better diabetes care      Individualized Goals (developed by patient)   Nutrition General guidelines for healthy choices and portions discussed    Physical Activity Exercise 5-7 days per week;30 minutes per day    Medications Not Applicable    Monitoring  Test my blood glucose as discussed    Problem Solving Eating Pattern    Reducing Risk examine blood glucose patterns;do foot checks daily;treat hypoglycemia with 15 grams of carbs if blood glucose less than 70mg /dL    Health Coping Ask for help with psychological, social, or emotional issues      Patient Self-Evaluation of Goals - Patient rates self as meeting previously set goals (% of time)   Nutrition >75% (most of the time)    Physical Activity >75% (most of the time)    Medications Not Applicable    Monitoring >75% (most of the time)    Problem Solving and behavior change strategies  >75% (most of the time)    Reducing Risk (treating acute and chronic complications) >75% (most of the time)    Health Coping >75% (most of the time)      Post-Education Assessment   Patient understands the diabetes disease and treatment process. Comprehends key points    Patient understands incorporating nutritional management into lifestyle. Demonstrates understanding / competency    Patient undertands incorporating physical activity into lifestyle. Demonstrates understanding / competency    Patient understands using medications safely. N/A    Patient understands monitoring blood glucose, interpreting and using results  Demonstrates understanding / competency    Patient understands prevention, detection, and treatment of acute complications. Comprehends key points    Patient understands prevention, detection, and treatment of chronic complications. Comprehends key points    Patient understands how to develop strategies to address psychosocial issues. Comprehends key points    Patient understands how to develop strategies to promote health/change behavior. Comprehends key points      Outcomes   Expected Outcomes Demonstrated interest in learning. Expect positive outcomes    Future DMSE PRN    Program Status Completed      Subsequent Visit   Since your last visit have you continued or begun to take your medications as prescribed? Yes             Individualized Plan for Diabetes Self-Management Training:   Learning Objective:  Patient will have a greater understanding of diabetes self-management. Patient education  plan is to attend individual and/or group sessions per assessed needs and concerns.   Plan:   Patient Instructions  Previous Goals:  Goal: Walk for 30 minutes 5x/wk - goal met, continue   Goal: aim to make 1/2 of your plate vegetables at least 2x/day - goal in progress, continue  Expected Outcomes:  Demonstrated interest in learning. Expect positive outcomes  Education material provided: no handout provided this follow up  If problems or questions, patient to contact team via:  Phone  Future DSME appointment: PRN

## 2023-06-04 NOTE — Patient Instructions (Signed)
Previous Goals:  Goal: Walk for 30 minutes 5x/wk - goal met, continue   Goal: aim to make 1/2 of your plate vegetables at least 2x/day - goal in progress, continue

## 2023-08-05 ENCOUNTER — Other Ambulatory Visit: Payer: Self-pay | Admitting: Family Medicine

## 2023-08-05 DIAGNOSIS — E782 Mixed hyperlipidemia: Secondary | ICD-10-CM
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
## 2023-08-26 ENCOUNTER — Ambulatory Visit: Payer: 59 | Admitting: Family Medicine

## 2023-08-26 ENCOUNTER — Encounter: Payer: Self-pay | Admitting: Family Medicine

## 2023-08-26 VITALS — BP 120/70 | HR 78 | Temp 98.3°F | Resp 12 | Ht 71.0 in | Wt 236.5 lb

## 2023-08-26 DIAGNOSIS — E782 Mixed hyperlipidemia: Secondary | ICD-10-CM | POA: Diagnosis not present

## 2023-08-26 DIAGNOSIS — Z125 Encounter for screening for malignant neoplasm of prostate: Secondary | ICD-10-CM

## 2023-08-26 DIAGNOSIS — Z23 Encounter for immunization: Secondary | ICD-10-CM

## 2023-08-26 DIAGNOSIS — R7401 Elevation of levels of liver transaminase levels: Secondary | ICD-10-CM | POA: Diagnosis not present

## 2023-08-26 DIAGNOSIS — Z0000 Encounter for general adult medical examination without abnormal findings: Secondary | ICD-10-CM | POA: Diagnosis not present

## 2023-08-26 DIAGNOSIS — E1169 Type 2 diabetes mellitus with other specified complication: Secondary | ICD-10-CM | POA: Diagnosis not present

## 2023-08-26 DIAGNOSIS — I8393 Asymptomatic varicose veins of bilateral lower extremities: Secondary | ICD-10-CM

## 2023-08-26 LAB — HEMOGLOBIN A1C: Hgb A1c MFr Bld: 5.7 % (ref 4.6–6.5)

## 2023-08-26 LAB — CBC
HCT: 45.7 % (ref 39.0–52.0)
Hemoglobin: 15.4 g/dL (ref 13.0–17.0)
MCHC: 33.7 g/dL (ref 30.0–36.0)
MCV: 89.1 fL (ref 78.0–100.0)
Platelets: 180 10*3/uL (ref 150.0–400.0)
RBC: 5.13 Mil/uL (ref 4.22–5.81)
RDW: 13 % (ref 11.5–15.5)
WBC: 8.5 10*3/uL (ref 4.0–10.5)

## 2023-08-26 LAB — HEPATIC FUNCTION PANEL
ALT: 24 U/L (ref 0–53)
AST: 24 U/L (ref 0–37)
Albumin: 4.5 g/dL (ref 3.5–5.2)
Alkaline Phosphatase: 73 U/L (ref 39–117)
Bilirubin, Direct: 0.2 mg/dL (ref 0.0–0.3)
Total Bilirubin: 1.1 mg/dL (ref 0.2–1.2)
Total Protein: 7.8 g/dL (ref 6.0–8.3)

## 2023-08-26 LAB — LIPID PANEL
Cholesterol: 114 mg/dL (ref 0–200)
HDL: 45.5 mg/dL (ref >39.00)
LDL Cholesterol: 49 mg/dL (ref 0–99)
NonHDL: 68.99
Total CHOL/HDL Ratio: 3
Triglycerides: 101 mg/dL (ref 0.0–149.0)
VLDL: 20.2 mg/dL (ref 0.0–40.0)

## 2023-08-26 LAB — BASIC METABOLIC PANEL
BUN: 17 mg/dL (ref 6–23)
CO2: 28 meq/L (ref 19–32)
Calcium: 9.8 mg/dL (ref 8.4–10.5)
Chloride: 100 meq/L (ref 96–112)
Creatinine, Ser: 1.02 mg/dL (ref 0.40–1.50)
GFR: 83.01 mL/min (ref >60.00)
Glucose, Bld: 116 mg/dL — ABNORMAL HIGH (ref 70–99)
Potassium: 4 meq/L (ref 3.5–5.1)
Sodium: 137 meq/L (ref 135–145)

## 2023-08-26 NOTE — Patient Instructions (Addendum)
A few things to remember from today's visit:  Routine general medical examination at a health care facility  Type 2 diabetes mellitus with other specified complication, without long-term current use of insulin (HCC) - Plan: Basic metabolic panel, Hemoglobin A1c  Hyperlipidemia, mixed - Plan: Lipid panel  Elevated transaminase level - Plan: Hepatic function panel, CBC  If you need refills for medications you take chronically, please call your pharmacy. Do not use My Chart to request refills or for acute issues that need immediate attention. If you send a my chart message, it may take a few days to be addressed, specially if I am not in the office.  Please be sure medication list is accurate. If a new problem present, please set up appointment sooner than planned today.  Health Maintenance, Male Adopting a healthy lifestyle and getting preventive care are important in promoting health and wellness. Ask your health care provider about: The right schedule for you to have regular tests and exams. Things you can do on your own to prevent diseases and keep yourself healthy. What should I know about diet, weight, and exercise? Eat a healthy diet  Eat a diet that includes plenty of vegetables, fruits, low-fat dairy products, and lean protein. Do not eat a lot of foods that are high in solid fats, added sugars, or sodium. Maintain a healthy weight Body mass index (BMI) is a measurement that can be used to identify possible weight problems. It estimates body fat based on height and weight. Your health care provider can help determine your BMI and help you achieve or maintain a healthy weight. Get regular exercise Get regular exercise. This is one of the most important things you can do for your health. Most adults should: Exercise for at least 150 minutes each week. The exercise should increase your heart rate and make you sweat (moderate-intensity exercise). Do strengthening exercises at least  twice a week. This is in addition to the moderate-intensity exercise. Spend less time sitting. Even light physical activity can be beneficial. Watch cholesterol and blood lipids Have your blood tested for lipids and cholesterol at 55 years of age, then have this test every 5 years. You may need to have your cholesterol levels checked more often if: Your lipid or cholesterol levels are high. You are older than 55 years of age. You are at high risk for heart disease. What should I know about cancer screening? Many types of cancers can be detected early and may often be prevented. Depending on your health history and family history, you may need to have cancer screening at various ages. This may include screening for: Colorectal cancer. Prostate cancer. Skin cancer. Lung cancer. What should I know about heart disease, diabetes, and high blood pressure? Blood pressure and heart disease High blood pressure causes heart disease and increases the risk of stroke. This is more likely to develop in people who have high blood pressure readings or are overweight. Talk with your health care provider about your target blood pressure readings. Have your blood pressure checked: Every 3-5 years if you are 46-75 years of age. Every year if you are 74 years old or older. If you are between the ages of 51 and 65 and are a current or former smoker, ask your health care provider if you should have a one-time screening for abdominal aortic aneurysm (AAA). Diabetes Have regular diabetes screenings. This checks your fasting blood sugar level. Have the screening done: Once every three years after age 40 if  you are at a normal weight and have a low risk for diabetes. More often and at a younger age if you are overweight or have a high risk for diabetes. What should I know about preventing infection? Hepatitis B If you have a higher risk for hepatitis B, you should be screened for this virus. Talk with your health  care provider to find out if you are at risk for hepatitis B infection. Hepatitis C Blood testing is recommended for: Everyone born from [birth year] through 1963. Anyone with known risk factors for hepatitis C. Sexually transmitted infections (STIs) You should be screened each year for STIs, including gonorrhea and chlamydia, if: You are sexually active and are younger than 55 years of age. You are older than 55 years of age and your health care provider tells you that you are at risk for this type of infection. Your sexual activity has changed since you were last screened, and you are at increased risk for chlamydia or gonorrhea. Ask your health care provider if you are at risk. Ask your health care provider about whether you are at high risk for HIV. Your health care provider may recommend a prescription medicine to help prevent HIV infection. If you choose to take medicine to prevent HIV, you should first get tested for HIV. You should then be tested every 3 months for as long as you are taking the medicine. Follow these instructions at home: Alcohol use Do not drink alcohol if your health care provider tells you not to drink. If you drink alcohol: Limit how much you have to 0-2 drinks a day. Know how much alcohol is in your drink. In the [place] one drink equals one 12 oz bottle of beer (355 mL), one 5 oz glass of wine (148 mL), or one 1 oz glass of hard liquor (44 mL). Lifestyle Do not use any products that contain nicotine or tobacco. These products include cigarettes, chewing tobacco, and vaping devices, such as e-cigarettes. If you need help quitting, ask your health care provider. Do not use street drugs. Do not share needles. Ask your health care provider for help if you need support or information about quitting drugs. General instructions Schedule regular health, dental, and eye exams. Stay current with your vaccines. Tell your health care provider if: You often feel depressed. You  have ever been abused or do not feel safe at home. Summary Adopting a healthy lifestyle and getting preventive care are important in promoting health and wellness. Follow your health care provider's instructions about healthy diet, exercising, and getting tested or screened for diseases. Follow your health care provider's instructions on monitoring your cholesterol and blood pressure. This information is not intended to replace advice given to you by your health care provider. Make sure you discuss any questions you have with your health care provider. Document Revised: 02/25/2021 Document Reviewed: 02/25/2021 Elsevier Patient Education  2024 [employer].

## 2023-08-26 NOTE — Assessment & Plan Note (Signed)
We discussed the importance of regular physical activity and healthy diet for prevention of chronic illness and/or complications. Preventive guidelines reviewed. Vaccination updated today. Next CPE in a year.

## 2023-08-26 NOTE — Assessment & Plan Note (Addendum)
Last LFT's was in normal range. Hepatic steatosis seen on abdominal imaging,abdominal CT in 02/2015 and MRI in 11/2015. Continue alcohol avoidance and statin as well a healthful diet. Further recommendations according to lab results.

## 2023-08-26 NOTE — Assessment & Plan Note (Signed)
Continue rosuvastatin 10 mg daily and low-fat diet. Further recommendation will be given according to lipid panel result. 

## 2023-08-26 NOTE — Assessment & Plan Note (Signed)
Problem has been adequately controlled on nonpharmacologic treatment. Further recommendation will be given according to hemoglobin A1c result. Annual eye exam recommended, he has an appt in 09/2023. Continue periodic dental and foot care. F/U in 5-6 months.

## 2023-08-26 NOTE — Assessment & Plan Note (Signed)
We discussed diagnosis, prognosis, and treatment options. He is asymptomatic. Compression stockings may help to prevent progression, recommended. Follow-up as needed.

## 2023-10-12 ENCOUNTER — Telehealth: Payer: Self-pay | Admitting: Endocrinology

## 2023-10-12 ENCOUNTER — Other Ambulatory Visit: Payer: Self-pay

## 2023-10-12 DIAGNOSIS — E782 Mixed hyperlipidemia: Secondary | ICD-10-CM

## 2023-10-12 MED ORDER — ROSUVASTATIN CALCIUM 10 MG PO TABS: ORAL_TABLET | ORAL | 11 refills | Status: DC

## 2023-10-12 NOTE — Telephone Encounter (Signed)
Patient asking for a refill on Levothyroxine 88 MCG called into [facility] [address]. Former Patient of Dr. Lucianne Muss

## 2023-10-16 ENCOUNTER — Other Ambulatory Visit: Payer: Self-pay

## 2023-10-16 DIAGNOSIS — E890 Postprocedural hypothyroidism: Secondary | ICD-10-CM

## 2023-10-16 MED ORDER — LEVOTHYROXINE SODIUM 88 MCG PO TABS: 88.0000 ug | ORAL_TABLET | Freq: Every day | ORAL | 3 refills | Status: DC

## 2023-10-22 ENCOUNTER — Other Ambulatory Visit: Payer: Self-pay

## 2023-10-23 ENCOUNTER — Other Ambulatory Visit: Payer: Self-pay | Admitting: Endocrinology

## 2023-10-23 ENCOUNTER — Other Ambulatory Visit: Payer: No Typology Code available for payment source

## 2023-10-23 DIAGNOSIS — E890 Postprocedural hypothyroidism: Secondary | ICD-10-CM

## 2023-10-24 LAB — TSH: TSH: 3.33 m[IU]/L (ref 0.40–4.50)

## 2023-10-24 LAB — T4, FREE: Free T4: 1 ng/dL (ref 0.8–1.8)

## 2023-10-27 ENCOUNTER — Ambulatory Visit (INDEPENDENT_AMBULATORY_CARE_PROVIDER_SITE_OTHER): Payer: No Typology Code available for payment source | Admitting: Endocrinology

## 2023-10-27 ENCOUNTER — Encounter: Payer: Self-pay | Admitting: Endocrinology

## 2023-10-27 VITALS — BP 112/70 | HR 62 | Resp 20 | Ht 71.0 in | Wt 246.4 lb

## 2023-10-27 DIAGNOSIS — E890 Postprocedural hypothyroidism: Secondary | ICD-10-CM

## 2023-10-27 MED ORDER — LEVOTHYROXINE SODIUM 88 MCG PO TABS: 88.0000 ug | ORAL_TABLET | Freq: Every day | ORAL | 4 refills | Status: DC
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
## 2023-11-20 ENCOUNTER — Encounter: Payer: Self-pay | Admitting: Family Medicine

## 2023-11-20 ENCOUNTER — Ambulatory Visit (INDEPENDENT_AMBULATORY_CARE_PROVIDER_SITE_OTHER): Payer: No Typology Code available for payment source | Admitting: Family Medicine

## 2023-11-20 VITALS — BP 120/80 | HR 60 | Resp 12 | Ht 71.0 in | Wt 243.1 lb

## 2023-11-20 DIAGNOSIS — E538 Deficiency of other specified B group vitamins: Secondary | ICD-10-CM

## 2023-11-20 DIAGNOSIS — G4733 Obstructive sleep apnea (adult) (pediatric): Secondary | ICD-10-CM

## 2023-11-20 DIAGNOSIS — M5416 Radiculopathy, lumbar region: Secondary | ICD-10-CM | POA: Diagnosis not present

## 2023-11-20 DIAGNOSIS — E1169 Type 2 diabetes mellitus with other specified complication: Secondary | ICD-10-CM | POA: Diagnosis not present

## 2023-11-20 DIAGNOSIS — M349 Systemic sclerosis, unspecified: Secondary | ICD-10-CM | POA: Diagnosis not present

## 2023-11-20 DIAGNOSIS — Z125 Encounter for screening for malignant neoplasm of prostate: Secondary | ICD-10-CM | POA: Diagnosis not present

## 2023-11-20 LAB — MICROALBUMIN / CREATININE URINE RATIO
Creatinine,U: 227 mg/dL
Microalb Creat Ratio: 0.5 mg/g (ref 0.0–30.0)
Microalb, Ur: 1.1 mg/dL (ref 0.0–1.9)

## 2023-11-20 LAB — VITAMIN B12: Vitamin B-12: 268 pg/mL (ref 211–911)

## 2023-11-20 LAB — HEMOGLOBIN A1C: Hgb A1c MFr Bld: 6.1 % (ref 4.6–6.5)

## 2023-11-20 LAB — PSA: PSA: 1.45 ng/mL (ref 0.10–4.00)

## 2023-11-20 NOTE — Assessment & Plan Note (Signed)
Problem has been adequately controlled. Last HgA1C 5.7 in 08/2023. Continue non pharmacologic treatment. Further recommendation will be given according to hemoglobin A1c result. Annual eye exam, periodic dental and foot care to continue. F/U in 5-6 months.

## 2023-11-20 NOTE — Assessment & Plan Note (Signed)
S/P lumbar surgery, residual anterior thigh tension like sensation and tingling around knee. Monitor for new symptoms. I do not think imaging is needed at this time. Stretching exercises recommended.

## 2023-11-20 NOTE — Assessment & Plan Note (Signed)
Reports problem as stable. He has not seen derma in a few years, he follows prn.

## 2023-11-20 NOTE — Patient Instructions (Addendum)
A few things to remember from today's visit:  Lumbar radiculopathy, chronic  Scleredema (HCC), Chronic  Type 2 diabetes mellitus with other specified complication, without long-term current use of insulin (HCC), Chronic - Plan: Microalbumin / creatinine urine ratio, Hemoglobin A1c  B12 deficiency - Plan: Vitamin B12  No cambios hoy. If you need refills for medications you take chronically, please call your pharmacy. Do not use My Chart to request refills or for acute issues that need immediate attention. If you send a my chart message, it may take a few days to be addressed, specially if I am not in the office.  Please be sure medication list is accurate. If a new problem present, please set up appointment sooner than planned today.

## 2023-11-20 NOTE — Assessment & Plan Note (Signed)
He is not on B12 supplementation. Further recommendations according to B12 result.

## 2023-11-20 NOTE — Assessment & Plan Note (Signed)
Wearing his CPAP every night, self regulated. Established with sleep specialist.
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
## 2024-08-25 NOTE — Patient Instructions (Signed)
 A few things to remember from today's visit:  Routine general medical examination at a health care facility  Type 2 diabetes mellitus with other specified complication, without long-term current use of insulin (HCC) - Plan: Microalbumin / creatinine urine ratio, Hemoglobin A1c  Hyperlipidemia, mixed - Plan: Comprehensive metabolic panel with GFR, Lipid panel  If you need refills for medications you take chronically, please call your pharmacy. Do not use My Chart to request refills or for acute issues that need immediate attention. If you send a my chart message, it may take a few days to be addressed, specially if I am not in the office.  Please be sure medication list is accurate. If a new problem present, please set up appointment sooner than planned today.  Health Maintenance, Male Adopting a healthy lifestyle and getting preventive care are important in promoting health and wellness. Ask your health care provider about: The right schedule for you to have regular tests and exams. Things you can do on your own to prevent diseases and keep yourself healthy. What should I know about diet, weight, and exercise? Eat a healthy diet  Eat a diet that includes plenty of vegetables, fruits, low-fat dairy products, and lean protein. Do not eat a lot of foods that are high in solid fats, added sugars, or sodium. Maintain a healthy weight Body mass index (BMI) is a measurement that can be used to identify possible weight problems. It estimates body fat based on height and weight. Your health care provider can help determine your BMI and help you achieve or maintain a healthy weight. Get regular exercise Get regular exercise. This is one of the most important things you can do for your health. Most adults should: Exercise for at least 150 minutes each week. The exercise should increase your heart rate and make you sweat (moderate-intensity exercise). Do strengthening exercises at least twice a  week. This is in addition to the moderate-intensity exercise. Spend less time sitting. Even light physical activity can be beneficial. Watch cholesterol and blood lipids Have your blood tested for lipids and cholesterol at 56 years of age, then have this test every 5 years. You may need to have your cholesterol levels checked more often if: Your lipid or cholesterol levels are high. You are older than 56 years of age. You are at high risk for heart disease. What should I know about cancer screening? Many types of cancers can be detected early and may often be prevented. Depending on your health history and family history, you may need to have cancer screening at various ages. This may include screening for: Colorectal cancer. Prostate cancer. Skin cancer. Lung cancer. What should I know about heart disease, diabetes, and high blood pressure? Blood pressure and heart disease High blood pressure causes heart disease and increases the risk of stroke. This is more likely to develop in people who have high blood pressure readings or are overweight. Talk with your health care provider about your target blood pressure readings. Have your blood pressure checked: Every 3-5 years if you are 24-5 years of age. Every year if you are 32 years old or older. If you are between the ages of 27 and 12 and are a current or former smoker, ask your health care provider if you should have a one-time screening for abdominal aortic aneurysm (AAA). Diabetes Have regular diabetes screenings. This checks your fasting blood sugar level. Have the screening done: Once every three years after age 73 if you are at  a normal weight and have a low risk for diabetes. More often and at a younger age if you are overweight or have a high risk for diabetes. What should I know about preventing infection? Hepatitis B If you have a higher risk for hepatitis B, you should be screened for this virus. Talk with your health care  provider to find out if you are at risk for hepatitis B infection. Hepatitis C Blood testing is recommended for: Everyone born from [birth year] through 1963. Anyone with known risk factors for hepatitis C. Sexually transmitted infections (STIs) You should be screened each year for STIs, including gonorrhea and chlamydia, if: You are sexually active and are younger than 56 years of age. You are older than 56 years of age and your health care provider tells you that you are at risk for this type of infection. Your sexual activity has changed since you were last screened, and you are at increased risk for chlamydia or gonorrhea. Ask your health care provider if you are at risk. Ask your health care provider about whether you are at high risk for HIV. Your health care provider may recommend a prescription medicine to help prevent HIV infection. If you choose to take medicine to prevent HIV, you should first get tested for HIV. You should then be tested every 3 months for as long as you are taking the medicine. Follow these instructions at home: Alcohol use Do not drink alcohol if your health care provider tells you not to drink. If you drink alcohol: Limit how much you have to 0-2 drinks a day. Know how much alcohol is in your drink. In the [place] one drink equals one 12 oz bottle of beer (355 mL), one 5 oz glass of wine (148 mL), or one 1 oz glass of hard liquor (44 mL). Lifestyle Do not use any products that contain nicotine or tobacco. These products include cigarettes, chewing tobacco, and vaping devices, such as e-cigarettes. If you need help quitting, ask your health care provider. Do not use street drugs. Do not share needles. Ask your health care provider for help if you need support or information about quitting drugs. General instructions Schedule regular health, dental, and eye exams. Stay current with your vaccines. Tell your health care provider if: You often feel depressed. You have  ever been abused or do not feel safe at home. Summary Adopting a healthy lifestyle and getting preventive care are important in promoting health and wellness. Follow your health care provider's instructions about healthy diet, exercising, and getting tested or screened for diseases. Follow your health care provider's instructions on monitoring your cholesterol and blood pressure. This information is not intended to replace advice given to you by your health care provider. Make sure you discuss any questions you have with your health care provider. Document Revised: 02/25/2021 Document Reviewed: 02/25/2021 Elsevier Patient Education  2024 [employer].

## 2024-08-26 ENCOUNTER — Encounter: Payer: Self-pay | Admitting: Family Medicine

## 2024-08-26 ENCOUNTER — Ambulatory Visit (INDEPENDENT_AMBULATORY_CARE_PROVIDER_SITE_OTHER): Admitting: Family Medicine

## 2024-08-26 ENCOUNTER — Ambulatory Visit: Payer: Self-pay | Admitting: Family Medicine

## 2024-08-26 VITALS — BP 102/72 | HR 56 | Temp 98.6°F | Resp 16 | Ht 70.32 in | Wt 246.4 lb

## 2024-08-26 DIAGNOSIS — E1169 Type 2 diabetes mellitus with other specified complication: Secondary | ICD-10-CM | POA: Diagnosis not present

## 2024-08-26 DIAGNOSIS — E782 Mixed hyperlipidemia: Secondary | ICD-10-CM | POA: Diagnosis not present

## 2024-08-26 DIAGNOSIS — Z23 Encounter for immunization: Secondary | ICD-10-CM

## 2024-08-26 DIAGNOSIS — Z0000 Encounter for general adult medical examination without abnormal findings: Secondary | ICD-10-CM | POA: Diagnosis not present

## 2024-08-26 LAB — LIPID PANEL
Cholesterol: 130 mg/dL (ref 0–200)
HDL: 44 mg/dL (ref >39.00)
LDL Cholesterol: 52 mg/dL (ref 0–99)
NonHDL: 86.13
Total CHOL/HDL Ratio: 3
Triglycerides: 169 mg/dL — ABNORMAL HIGH (ref 0.0–149.0)
VLDL: 33.8 mg/dL (ref 0.0–40.0)

## 2024-08-26 LAB — COMPREHENSIVE METABOLIC PANEL WITH GFR
ALT: 50 U/L (ref 0–53)
AST: 47 U/L — ABNORMAL HIGH (ref 0–37)
Albumin: 4.5 g/dL (ref 3.5–5.2)
Alkaline Phosphatase: 66 U/L (ref 39–117)
BUN: 16 mg/dL (ref 6–23)
CO2: 29 meq/L (ref 19–32)
Calcium: 9.6 mg/dL (ref 8.4–10.5)
Chloride: 100 meq/L (ref 96–112)
Creatinine, Ser: 1.04 mg/dL (ref 0.40–1.50)
GFR: 80.53 mL/min (ref >60.00)
Glucose, Bld: 99 mg/dL (ref 70–99)
Potassium: 4.2 meq/L (ref 3.5–5.1)
Sodium: 139 meq/L (ref 135–145)
Total Bilirubin: 0.9 mg/dL (ref 0.2–1.2)
Total Protein: 8 g/dL (ref 6.0–8.3)

## 2024-08-26 LAB — MICROALBUMIN / CREATININE URINE RATIO
Creatinine,U: 77.7 mg/dL
Microalb Creat Ratio: UNDETERMINED mg/g (ref 0.0–30.0)
Microalb, Ur: <0.7 mg/dL

## 2024-08-26 LAB — HEMOGLOBIN A1C: Hgb A1c MFr Bld: 6.2 % (ref 4.6–6.5)

## 2024-08-26 NOTE — Assessment & Plan Note (Addendum)
 Continue rosuvastatin  10 mg 3 times per week and low-fat diet. Last LDL 49 in 08/2023. Further recommendation will be given according to lipid panel result.

## 2024-08-26 NOTE — Assessment & Plan Note (Signed)
 We discussed the importance of regular physical activity and healthy diet for prevention of chronic illness and/or complications. Preventive guidelines reviewed. Vaccination: Hep B and hep A vaccination given today. Next CPE in a year.

## 2024-08-26 NOTE — Assessment & Plan Note (Signed)
 Problem is adequately controlled, last hemoglobin A1c 6.1 in 10/2023. Currently on no pharmacologic treatment, further recommendation will be given according to hemoglobin A1c result. Annual eye exam, periodic dental and foot care to continue. F/U in 5-6 months.

## 2024-08-27 ENCOUNTER — Other Ambulatory Visit: Payer: Self-pay | Admitting: Family Medicine

## 2024-08-27 DIAGNOSIS — E782 Mixed hyperlipidemia: Secondary | ICD-10-CM

## 2024-11-06 ENCOUNTER — Other Ambulatory Visit: Payer: Self-pay | Admitting: Endocrinology

## 2024-11-06 DIAGNOSIS — E890 Postprocedural hypothyroidism: Secondary | ICD-10-CM
